# Patient Record
Sex: Male | Born: 1960
Health system: Southern US, Community
[De-identification: ages and names within clinical notes are randomized; demographics above are authoritative.]

## PROBLEM LIST (undated history)

## (undated) ENCOUNTER — Ambulatory Visit

## (undated) DIAGNOSIS — R634 Abnormal weight loss: Secondary | ICD-10-CM

## (undated) DIAGNOSIS — F419 Anxiety disorder, unspecified: Secondary | ICD-10-CM

## (undated) DIAGNOSIS — D509 Iron deficiency anemia, unspecified: Secondary | ICD-10-CM

## (undated) DIAGNOSIS — D649 Anemia, unspecified: Secondary | ICD-10-CM

## (undated) DIAGNOSIS — F1020 Alcohol dependence, uncomplicated: Secondary | ICD-10-CM

## (undated) DIAGNOSIS — E785 Hyperlipidemia, unspecified: Secondary | ICD-10-CM

## (undated) DIAGNOSIS — R011 Cardiac murmur, unspecified: Secondary | ICD-10-CM

## (undated) DIAGNOSIS — K449 Diaphragmatic hernia without obstruction or gangrene: Secondary | ICD-10-CM

## (undated) DIAGNOSIS — K219 Gastro-esophageal reflux disease without esophagitis: Secondary | ICD-10-CM

## (undated) DIAGNOSIS — K760 Fatty (change of) liver, not elsewhere classified: Secondary | ICD-10-CM

## (undated) DIAGNOSIS — F32A Depression, unspecified: Secondary | ICD-10-CM

## (undated) DIAGNOSIS — R1310 Dysphagia, unspecified: Secondary | ICD-10-CM

## (undated) DIAGNOSIS — I10 Essential (primary) hypertension: Secondary | ICD-10-CM

## (undated) DIAGNOSIS — K222 Esophageal obstruction: Secondary | ICD-10-CM

## (undated) DIAGNOSIS — K900 Celiac disease: Secondary | ICD-10-CM

## (undated) HISTORY — DX: Hyperlipidemia, unspecified: E78.5

## (undated) HISTORY — PX: HEMORRHOID SURGERY: SHX153

## (undated) HISTORY — DX: Anxiety disorder, unspecified: F41.9

## (undated) HISTORY — PX: INNER EAR SURGERY: SHX679

## (undated) HISTORY — DX: Gastro-esophageal reflux disease without esophagitis: K21.9

## (undated) HISTORY — DX: Essential (primary) hypertension: I10

## (undated) HISTORY — DX: Alcohol dependence, uncomplicated: F10.20

## (undated) HISTORY — DX: Depression, unspecified: F32.A

## (undated) HISTORY — DX: Anemia, unspecified: D64.9

---

## 2009-04-14 HISTORY — PX: LUMBAR FUSION: SHX111

## 2012-06-10 DIAGNOSIS — Z8669 Personal history of other diseases of the nervous system and sense organs: Secondary | ICD-10-CM | POA: Insufficient documentation

## 2012-06-10 DIAGNOSIS — H9072 Mixed conductive and sensorineural hearing loss, unilateral, left ear, with unrestricted hearing on the contralateral side: Secondary | ICD-10-CM | POA: Insufficient documentation

## 2013-12-28 ENCOUNTER — Ambulatory Visit: Payer: Self-pay | Admitting: Physician Assistant

## 2018-01-07 DIAGNOSIS — I10 Essential (primary) hypertension: Secondary | ICD-10-CM | POA: Insufficient documentation

## 2019-05-12 DIAGNOSIS — E559 Vitamin D deficiency, unspecified: Secondary | ICD-10-CM | POA: Insufficient documentation

## 2019-05-12 DIAGNOSIS — F419 Anxiety disorder, unspecified: Secondary | ICD-10-CM | POA: Insufficient documentation

## 2019-05-12 DIAGNOSIS — D72819 Decreased white blood cell count, unspecified: Secondary | ICD-10-CM | POA: Insufficient documentation

## 2019-05-12 DIAGNOSIS — E785 Hyperlipidemia, unspecified: Secondary | ICD-10-CM | POA: Insufficient documentation

## 2019-05-12 DIAGNOSIS — F32A Depression, unspecified: Secondary | ICD-10-CM | POA: Insufficient documentation

## 2019-05-12 DIAGNOSIS — Z6830 Body mass index (BMI) 30.0-30.9, adult: Secondary | ICD-10-CM | POA: Insufficient documentation

## 2019-05-12 DIAGNOSIS — E6609 Other obesity due to excess calories: Secondary | ICD-10-CM | POA: Insufficient documentation

## 2019-05-12 DIAGNOSIS — K219 Gastro-esophageal reflux disease without esophagitis: Secondary | ICD-10-CM | POA: Insufficient documentation

## 2020-05-22 ENCOUNTER — Other Ambulatory Visit: Payer: Self-pay

## 2020-05-22 ENCOUNTER — Encounter: Payer: Self-pay | Admitting: Family Medicine

## 2020-05-22 ENCOUNTER — Ambulatory Visit: Payer: Medicare PPO | Admitting: Family Medicine

## 2020-05-22 VITALS — BP 120/80 | HR 88 | Ht 70.0 in | Wt 203.0 lb

## 2020-05-22 DIAGNOSIS — Z7689 Persons encountering health services in other specified circumstances: Secondary | ICD-10-CM | POA: Diagnosis not present

## 2020-05-22 DIAGNOSIS — F320 Major depressive disorder, single episode, mild: Secondary | ICD-10-CM

## 2020-05-22 NOTE — Progress Notes (Addendum)
Date:  05/22/2020   Name:  Chad Hudson   DOB:  06/01/1960   MRN:  [id number]   Chief Complaint: Establish Care  Patient is a 60 year old male who presents for a establishment care exam. The patient reports the following problems: establish care. Health maintenance has been reviewed up to date.  Depression        This is a chronic problem.  The current episode started more than 1 year ago.   The onset quality is gradual.   The problem has been gradually improving since onset.  Associated symptoms include fatigue, insomnia, decreased interest and sad.  Associated symptoms include no decreased concentration, no helplessness, no hopelessness, not irritable, no restlessness, no appetite change, no body aches, no myalgias, no headaches, no indigestion and no suicidal ideas.  Past treatments include SSRIs - Selective serotonin reuptake inhibitors.   No results found for: CREATININE, BUN, NA, K, CL, CO2 No results found for: CHOL, HDL, LDLCALC, LDLDIRECT, TRIG, CHOLHDL No results found for: TSH No results found for: HGBA1C No results found for: WBC, HGB, HCT, MCV, PLT No results found for: ALT, AST, GGT, ALKPHOS, BILITOT   Review of Systems  Constitutional: Positive for fatigue. Negative for appetite change, chills and fever.  HENT: Negative for drooling, ear discharge, ear pain and sore throat.   Respiratory: Negative for cough, chest tightness, shortness of breath and wheezing.   Cardiovascular: Negative for chest pain, palpitations and leg swelling.  Gastrointestinal: Negative for abdominal pain, blood in stool, constipation, diarrhea and nausea.  Endocrine: Negative for polydipsia.  Genitourinary: Negative for dysuria, frequency, hematuria and urgency.  Musculoskeletal: Negative for back pain, myalgias and neck pain.  Skin: Negative for rash.  Allergic/Immunologic: Negative for environmental allergies.  Neurological: Negative for dizziness and headaches.  Hematological: Does  not bruise/bleed easily.  Psychiatric/Behavioral: Positive for depression. Negative for decreased concentration and suicidal ideas. The patient has insomnia. The patient is not nervous/anxious.     Patient Active Problem List   Diagnosis Date Noted  . Class 1 obesity due to excess calories without serious comorbidity with body mass index (BMI) of 30.0 to 30.9 in adult 05/12/2019  . Depression 05/12/2019  . Gastroesophageal reflux disease 05/12/2019  . Hyperlipidemia 05/12/2019  . Leukopenia 05/12/2019  . Essential hypertension 01/07/2018  . History of cholesteatoma 06/10/2012    No Known Allergies  Past Surgical History:  Procedure Laterality Date  . HEMORRHOID SURGERY    . INNER EAR SURGERY    . LUMBAR FUSION  2011    Social History   Tobacco Use  . Smoking status: Former Smoker    Years: 10.00    Quit date: 04/15/2015    Years since quitting: 5.1  . Smokeless tobacco: Never Used  Substance Use Topics  . Alcohol use: Yes    Comment: liquor/ daily- 5 or 6 daily  . Drug use: Not Currently     Medication list has been reviewed and updated.  Current Meds  Medication Sig  . amLODipine (NORVASC) 10 MG tablet Take 1 tablet by mouth daily.  Marland Kitchen losartan (COZAAR) 100 MG tablet Take 1 tablet by mouth daily.  Marland Kitchen omeprazole (PRILOSEC) 40 MG capsule Take 1 capsule by mouth every morning.  . sertraline (ZOLOFT) 100 MG tablet Take 2 tablets by mouth daily.    PHQ 2/9 Scores 05/22/2020  PHQ - 2 Score 3  PHQ- 9 Score 10    GAD 7 : Generalized Anxiety Score 05/22/2020  Nervous,  Anxious, on Edge 0  Control/stop worrying 3  Worry too much - different things 2  Trouble relaxing 0  Restless 1  Easily annoyed or irritable 0  Afraid - awful might happen 0  Total GAD 7 Score 6  Anxiety Difficulty Not difficult at all    BP Readings from Last 3 Encounters:  05/22/20 120/80    Physical Exam Vitals and nursing note reviewed.  Constitutional:      General: He is not  irritable. HENT:     Head: Normocephalic.     Right Ear: Tympanic membrane, ear canal and external ear normal. There is no impacted cerumen.     Left Ear: Tympanic membrane, ear canal and external ear normal. There is no impacted cerumen.     Nose: Nose normal. No congestion or rhinorrhea.     Mouth/Throat:     Mouth: Oropharynx is clear and moist. Mucous membranes are moist.  Eyes:     General: No scleral icterus.       Right eye: No discharge.        Left eye: No discharge.     Extraocular Movements: EOM normal.     Conjunctiva/sclera: Conjunctivae normal.     Pupils: Pupils are equal, round, and reactive to light.  Neck:     Thyroid: No thyromegaly.     Vascular: No JVD.     Trachea: No tracheal deviation.  Cardiovascular:     Rate and Rhythm: Normal rate and regular rhythm.     Pulses: Intact distal pulses.     Heart sounds: Normal heart sounds. No murmur heard. No friction rub. No gallop.   Pulmonary:     Effort: No respiratory distress.     Breath sounds: Normal breath sounds. No wheezing, rhonchi or rales.  Abdominal:     General: Bowel sounds are normal.     Palpations: Abdomen is soft. There is no hepatosplenomegaly or mass.     Tenderness: There is no abdominal tenderness. There is no CVA tenderness, guarding or rebound.  Musculoskeletal:        General: No tenderness or edema. Normal range of motion.     Cervical back: Normal range of motion and neck supple.  Lymphadenopathy:     Cervical: No cervical adenopathy.  Skin:    General: Skin is warm.     Findings: No rash.  Neurological:     Mental Status: He is alert and oriented to person, place, and time.     Cranial Nerves: No cranial nerve deficit.     Deep Tendon Reflexes: Strength normal and reflexes are normal and symmetric.     Wt Readings from Last 3 Encounters:  05/22/20 203 lb (92.1 kg)    BP 120/80   Pulse 88   Ht 5\' 10"  (1.778 m)   Wt 203 lb (92.1 kg)   BMI 29.13 kg/m   Assessment and  Plan:  1. Establishing care with new doctor, encounter for Establishing care with new physician.  Patient is on multiple medications but has sufficient amounts until we can recheck him in 4 weeks at which time we will currently do fasting labs.  Current diagnoses include depression, anemia, hypertension, hyperlipidemia, and gastroesophageal reflux disease.  2. Current mild episode of major depressive disorder, unspecified whether recurrent (HCC) Chronic.  Seemingly under control with medication/sertraline 200 mg daily.  Patient has a history of alcoholism.  We will refer to psychiatry given the dosing of sertraline that he is on and has encouraged  him to discuss his ongoing situation with alcohol. - Ambulatory referral to Psychiatry  We had a discussion about his history of alcoholism which is currently 5-6 airline bottles of alcohol daily which is an improvement from his past.  Because of this and the medications I told him that I would feel more comfortable with referral to psychiatry for follow-up on his psychiatric medication and perhaps discussion on approach for treatment in the future.

## 2020-06-20 ENCOUNTER — Other Ambulatory Visit: Payer: Self-pay

## 2020-06-20 ENCOUNTER — Encounter: Payer: Self-pay | Admitting: Family Medicine

## 2020-06-20 ENCOUNTER — Ambulatory Visit: Payer: Medicare PPO | Admitting: Family Medicine

## 2020-06-20 VITALS — BP 150/100 | HR 80 | Ht 70.0 in | Wt 203.0 lb

## 2020-06-20 DIAGNOSIS — Z862 Personal history of diseases of the blood and blood-forming organs and certain disorders involving the immune mechanism: Secondary | ICD-10-CM

## 2020-06-20 DIAGNOSIS — K219 Gastro-esophageal reflux disease without esophagitis: Secondary | ICD-10-CM

## 2020-06-20 DIAGNOSIS — I10 Essential (primary) hypertension: Secondary | ICD-10-CM

## 2020-06-20 DIAGNOSIS — E78019 Familial hypercholesterolemia, unspecified: Secondary | ICD-10-CM

## 2020-06-20 DIAGNOSIS — R1314 Dysphagia, pharyngoesophageal phase: Secondary | ICD-10-CM

## 2020-06-20 DIAGNOSIS — E7801 Familial hypercholesterolemia: Secondary | ICD-10-CM

## 2020-06-20 DIAGNOSIS — F32A Depression, unspecified: Secondary | ICD-10-CM

## 2020-06-20 DIAGNOSIS — F419 Anxiety disorder, unspecified: Secondary | ICD-10-CM

## 2020-06-20 MED ORDER — LOSARTAN POTASSIUM 100 MG PO TABS: 100.0000 mg | ORAL_TABLET | Freq: Every day | ORAL | 1 refills | Status: DC

## 2020-06-20 MED ORDER — SERTRALINE HCL 100 MG PO TABS: 200.0000 mg | ORAL_TABLET | Freq: Every day | ORAL | 1 refills | Status: DC

## 2020-06-20 MED ORDER — ATORVASTATIN CALCIUM 20 MG PO TABS
40.0000 mg | ORAL_TABLET | Freq: Every day | ORAL | 1 refills | Status: DC
Start: 2020-06-20 — End: 2020-08-03

## 2020-06-20 MED ORDER — AMLODIPINE BESYLATE 10 MG PO TABS: 10.0000 mg | ORAL_TABLET | Freq: Every day | ORAL | 1 refills | Status: DC

## 2020-06-20 MED ORDER — OMEPRAZOLE 40 MG PO CPDR: 40.0000 mg | DELAYED_RELEASE_CAPSULE | Freq: Every morning | ORAL | 1 refills | Status: DC

## 2020-06-20 NOTE — Progress Notes (Signed)
Date:  06/20/2020   Name:  Chad Hudson [facility]   DOB:  Oct 13, 1960   MRN:  [id number]   Chief Complaint: Hypertension, Hyperlipidemia, Gastroesophageal Reflux, Depression, and Anemia (Use to take RX iron supplement- been off x 2 months)  Hypertension This is a chronic problem. The current episode started more than 1 year ago. The problem has been gradually worsening since onset. The problem is uncontrolled. Pertinent negatives include no anxiety, blurred vision, chest pain, headaches, malaise/fatigue, neck pain, orthopnea, palpitations, peripheral edema, PND, shortness of breath or sweats. There are no associated agents to hypertension. Risk factors for coronary artery disease include diabetes mellitus and dyslipidemia. The current treatment provides moderate improvement. There are no compliance problems.  There is no history of angina, kidney disease, CAD/MI, CVA, heart failure, left ventricular hypertrophy, PVD or retinopathy. There is no history of chronic renal disease, a hypertension causing med or renovascular disease.  Hyperlipidemia This is a chronic problem. The current episode started more than 1 year ago. The problem is controlled. Recent lipid tests were reviewed and are variable. He has no history of chronic renal disease, diabetes, hypothyroidism, liver disease, obesity or nephrotic syndrome. Pertinent negatives include no chest pain, focal sensory loss, focal weakness, leg pain, myalgias or shortness of breath. Current antihyperlipidemic treatment includes diet change.  Gastroesophageal Reflux He complains of dysphagia. He reports no abdominal pain, no belching, no chest pain, no choking, no coughing, no early satiety, no globus sensation, no heartburn, no hoarse voice, no nausea, no sore throat, no stridor, no tooth decay, no water brash or no wheezing. This is a chronic problem. The current episode started more than 1 year ago. The problem has been gradually improving. The symptoms  are aggravated by certain foods (dysphagia). Pertinent negatives include no anemia, fatigue, melena, muscle weakness, orthopnea or weight loss. He has tried a PPI for the symptoms. The treatment provided moderate relief. Past procedures do not include an abdominal ultrasound.  Depression        This is a chronic problem.  The current episode started more than 1 year ago.   The problem occurs intermittently.  The problem has been gradually improving since onset.  Associated symptoms include no fatigue, no myalgias and no headaches.  Past treatments include SSRIs - Selective serotonin reuptake inhibitors.  Compliance with treatment is good.   Pertinent negatives include no hypothyroidism and no anxiety. Anemia Presents for follow-up visit. There has been no abdominal pain, bruising/bleeding easily, confusion, fever, leg swelling, malaise/fatigue, pallor, palpitations, paresthesias or weight loss. Signs of blood loss that are present include hematochezia. Signs of blood loss that are not present include hematemesis and melena. (Hx of hemarrhoids)  There is no history of chronic renal disease, heart failure or hypothyroidism.    No results found for: CREATININE, BUN, NA, K, CL, CO2 No results found for: CHOL, HDL, LDLCALC, LDLDIRECT, TRIG, CHOLHDL No results found for: TSH No results found for: HGBA1C No results found for: WBC, HGB, HCT, MCV, PLT No results found for: ALT, AST, GGT, ALKPHOS, BILITOT   Review of Systems  Constitutional: Negative for fatigue, fever, malaise/fatigue and weight loss.  HENT: Negative for hoarse voice and sore throat.   Eyes: Negative for blurred vision.  Respiratory: Negative for cough, choking, shortness of breath and wheezing.   Cardiovascular: Negative for chest pain, palpitations, orthopnea and PND.  Gastrointestinal: Positive for dysphagia and hematochezia. Negative for abdominal pain, heartburn, hematemesis, melena and nausea.  Musculoskeletal: Negative for  myalgias,  muscle weakness and neck pain.  Skin: Negative for pallor.  Neurological: Negative for focal weakness, headaches and paresthesias.  Hematological: Does not bruise/bleed easily.  Psychiatric/Behavioral: Positive for depression. Negative for confusion.    Patient Active Problem List   Diagnosis Date Noted  . Class 1 obesity due to excess calories without serious comorbidity with body mass index (BMI) of 30.0 to 30.9 in adult 05/12/2019  . Depression 05/12/2019  . Gastroesophageal reflux disease 05/12/2019  . Hyperlipidemia 05/12/2019  . Leukopenia 05/12/2019  . Essential hypertension 01/07/2018  . History of cholesteatoma 06/10/2012    No Known Allergies  Past Surgical History:  Procedure Laterality Date  . HEMORRHOID SURGERY    . INNER EAR SURGERY    . LUMBAR FUSION  2011    Social History   Tobacco Use  . Smoking status: Former Smoker    Years: 10.00    Quit date: 04/15/2015    Years since quitting: 5.1  . Smokeless tobacco: Never Used  Substance Use Topics  . Alcohol use: Yes    Comment: liquor/ daily- 5 or 6 daily  . Drug use: Not Currently     Medication list has been reviewed and updated.  Current Meds  Medication Sig  . amLODipine (NORVASC) 10 MG tablet Take 1 tablet by mouth daily.  Marland Kitchen losartan (COZAAR) 100 MG tablet Take 1 tablet by mouth daily.  Marland Kitchen omeprazole (PRILOSEC) 40 MG capsule Take 1 capsule by mouth every morning.  . sertraline (ZOLOFT) 100 MG tablet Take 2 tablets by mouth daily.    PHQ 2/9 Scores 06/20/2020 05/22/2020  PHQ - 2 Score 1 3  PHQ- 9 Score 1 10    GAD 7 : Generalized Anxiety Score 06/20/2020 05/22/2020  Nervous, Anxious, on Edge 0 0  Control/stop worrying 0 3  Worry too much - different things 0 2  Trouble relaxing 0 0  Restless 0 1  Easily annoyed or irritable 0 0  Afraid - awful might happen 0 0  Total GAD 7 Score 0 6  Anxiety Difficulty - Not difficult at all    BP Readings from Last 3 Encounters:  06/20/20 (!)  150/100  05/22/20 120/80    Physical Exam  Wt Readings from Last 3 Encounters:  06/20/20 203 lb (92.1 kg)  05/22/20 203 lb (92.1 kg)    BP (!) 150/100   Pulse 80   Ht 5\' 10"  (1.778 m)   Wt 203 lb (92.1 kg)   BMI 29.13 kg/m   Assessment and Plan: 1. Essential hypertension Chronic.  Uncontrolled.  (Patient did not take antihypertensive meds today) stable.  I have discussed with the patient importance of taking medication and we need to recheck it in 6 weeks at which time hopefully is going to be within range patient will continue on his amlodipine 10 mg once a day and losartan 100 mg once a day.  We will check renal function panel for electrolytes and GFR. - Renal Function Panel - amLODipine (NORVASC) 10 MG tablet; Take 1 tablet (10 mg total) by mouth daily.  Dispense: 90 tablet; Refill: 1 - losartan (COZAAR) 100 MG tablet; Take 1 tablet (100 mg total) by mouth daily.  Dispense: 90 tablet; Refill: 1  2. Gastroesophageal reflux disease without esophagitis Chronic.  Controlled.  Stable.  Patient will continue on omeprazole 40 mg once a day. - omeprazole (PRILOSEC) 40 MG capsule; Take 1 capsule (40 mg total) by mouth every morning.  Dispense: 90 capsule; Refill: 1  3. Pharyngoesophageal  dysphagia Chronic.  Controlled.  Stable.  Patient has had history of dysphagia and we will send for his last colonoscopy and endoscopies that were done in [place]. - omeprazole (PRILOSEC) 40 MG capsule; Take 1 capsule (40 mg total) by mouth every morning.  Dispense: 90 capsule; Refill: 1  4. History of anemia Chronic.  Controlled.  Stable.  Patient says he had a history of hemorrhoids which has been corrected by surgery.  We will check CBC if this is the case. - CBC w/Diff/Platelet  5. Anxiety and depression Chronic.  Controlled.  Stable.  Continue atorvastatin 20 mg 2 tablets once a day as well as checking a lipid panel today for current status. - sertraline (ZOLOFT) 100 MG tablet; Take 2 tablets  (200 mg total) by mouth daily.  Dispense: 180 tablet; Refill: 1  6. Familial hypercholesterolemia Chronic.  Controlled.  Stable.  Continue atorvastatin 20 mg 2 tablets once a day. - Lipid Panel With LDL/HDL Ratio - atorvastatin (LIPITOR) 20 MG tablet; Take 2 tablets (40 mg total) by mouth daily.  Dispense: 180 tablet; Refill: 1

## 2020-06-21 ENCOUNTER — Other Ambulatory Visit: Payer: Self-pay

## 2020-06-21 LAB — LIPID PANEL WITH LDL/HDL RATIO
Cholesterol, Total: 243 mg/dL — ABNORMAL HIGH (ref 100–199)
HDL: 68 mg/dL (ref >39)
LDL Chol Calc (NIH): 145 mg/dL — ABNORMAL HIGH (ref 0–99)
LDL/HDL Ratio: 2.1 ratio (ref 0.0–3.6)
Triglycerides: 169 mg/dL — ABNORMAL HIGH (ref 0–149)
VLDL Cholesterol Cal: 30 mg/dL (ref 5–40)

## 2020-06-21 LAB — RENAL FUNCTION PANEL
Albumin: 4.9 g/dL (ref 3.8–4.9)
BUN/Creatinine Ratio: 13 (ref 9–20)
BUN: 13 mg/dL (ref 6–24)
CO2: 19 mmol/L — ABNORMAL LOW (ref 20–29)
Calcium: 9.5 mg/dL (ref 8.7–10.2)
Chloride: 100 mmol/L (ref 96–106)
Creatinine, Ser: 0.97 mg/dL (ref 0.76–1.27)
Glucose: 72 mg/dL (ref 65–99)
Phosphorus: 3.4 mg/dL (ref 2.8–4.1)
Potassium: 4.4 mmol/L (ref 3.5–5.2)
Sodium: 142 mmol/L (ref 134–144)
eGFR: 91 mL/min/{1.73_m2} (ref >59)

## 2020-06-21 LAB — CBC WITH DIFFERENTIAL/PLATELET
Basophils Absolute: 0 10*3/uL (ref 0.0–0.2)
Basos: 0 %
EOS (ABSOLUTE): 0.1 10*3/uL (ref 0.0–0.4)
Eos: 1 %
Hematocrit: 37.7 % (ref 37.5–51.0)
Hemoglobin: 10.9 g/dL — ABNORMAL LOW (ref 13.0–17.7)
Immature Grans (Abs): 0 10*3/uL (ref 0.0–0.1)
Immature Granulocytes: 0 %
Lymphocytes Absolute: 1.2 10*3/uL (ref 0.7–3.1)
Lymphs: 13 %
MCH: 21 pg — ABNORMAL LOW (ref 26.6–33.0)
MCHC: 28.9 g/dL — ABNORMAL LOW (ref 31.5–35.7)
MCV: 73 fL — ABNORMAL LOW (ref 79–97)
Monocytes Absolute: 0.4 10*3/uL (ref 0.1–0.9)
Monocytes: 5 %
Neutrophils Absolute: 7.2 10*3/uL — ABNORMAL HIGH (ref 1.4–7.0)
Neutrophils: 81 %
Platelets: 202 10*3/uL (ref 150–450)
RBC: 5.2 x10E6/uL (ref 4.14–5.80)
RDW: 19.7 % — ABNORMAL HIGH (ref 11.6–15.4)
WBC: 9 10*3/uL (ref 3.4–10.8)

## 2020-06-22 NOTE — Progress Notes (Signed)
Tried calling patient again. 2nd attempt. Left VM for him to call the office back.

## 2020-06-25 LAB — FERRITIN: Ferritin: 26 ng/mL — ABNORMAL LOW (ref 30–400)

## 2020-06-25 LAB — SPECIMEN STATUS REPORT

## 2020-07-11 ENCOUNTER — Other Ambulatory Visit (INDEPENDENT_AMBULATORY_CARE_PROVIDER_SITE_OTHER): Payer: Medicare PPO

## 2020-07-11 ENCOUNTER — Other Ambulatory Visit: Payer: Self-pay

## 2020-07-11 DIAGNOSIS — Z862 Personal history of diseases of the blood and blood-forming organs and certain disorders involving the immune mechanism: Secondary | ICD-10-CM | POA: Diagnosis not present

## 2020-07-11 DIAGNOSIS — R195 Other fecal abnormalities: Secondary | ICD-10-CM

## 2020-07-11 LAB — HEMOCCULT GUIAC POC 1CARD (OFFICE)
Card #2 Fecal Occult Blod, POC: POSITIVE
Card #3 Fecal Occult Blood, POC: NEGATIVE
Fecal Occult Blood, POC: POSITIVE — AB

## 2020-07-11 NOTE — Progress Notes (Signed)
Placed referral to GI 

## 2020-07-12 ENCOUNTER — Encounter: Payer: Self-pay | Admitting: *Deleted

## 2020-08-03 ENCOUNTER — Ambulatory Visit: Payer: Medicare PPO | Admitting: Family Medicine

## 2020-08-03 ENCOUNTER — Telehealth: Payer: Self-pay | Admitting: Gastroenterology

## 2020-08-03 ENCOUNTER — Other Ambulatory Visit: Payer: Self-pay

## 2020-08-03 ENCOUNTER — Encounter: Payer: Self-pay | Admitting: Family Medicine

## 2020-08-03 VITALS — BP 138/80 | HR 72 | Ht 70.0 in | Wt 200.0 lb

## 2020-08-03 DIAGNOSIS — R195 Other fecal abnormalities: Secondary | ICD-10-CM

## 2020-08-03 DIAGNOSIS — R079 Chest pain, unspecified: Secondary | ICD-10-CM

## 2020-08-03 DIAGNOSIS — I10 Essential (primary) hypertension: Secondary | ICD-10-CM

## 2020-08-03 DIAGNOSIS — F1010 Alcohol abuse, uncomplicated: Secondary | ICD-10-CM

## 2020-08-03 DIAGNOSIS — R1314 Dysphagia, pharyngoesophageal phase: Secondary | ICD-10-CM

## 2020-08-03 DIAGNOSIS — E7801 Familial hypercholesterolemia: Secondary | ICD-10-CM | POA: Diagnosis not present

## 2020-08-03 MED ORDER — EZETIMIBE 10 MG PO TABS: 10.0000 mg | ORAL_TABLET | Freq: Every day | ORAL | 0 refills | Status: DC

## 2020-08-03 NOTE — Progress Notes (Signed)
Date:  08/03/2020   Name:  Chad Hudson [facility]   DOB:  05/12/1960   MRN:  [id number]   Chief Complaint: Anemia (Did not start the ferrous sulfate), Blood In Stools (Positive hems- did not answer call for appt to be scheduled.), and Hyperlipidemia (States cannot take atorvastatin- makes heartburn worse)  Anemia Presents for follow-up visit. There has been no bruising/bleeding easily, malaise/fatigue, palpitations or paresthesias. Signs of blood loss that are not present include hematemesis, hematochezia, melena, menorrhagia and vaginal bleeding. There is no history of chronic renal disease or hypothyroidism. There are no compliance problems.   Hyperlipidemia This is a chronic problem. The current episode started more than 1 year ago. The problem is controlled. He has no history of chronic renal disease, diabetes, hypothyroidism, liver disease, obesity or nephrotic syndrome. Factors aggravating his hyperlipidemia include thiazides. Associated symptoms include shortness of breath. Pertinent negatives include no chest pain, focal sensory loss, focal weakness, leg pain or myalgias. Current antihyperlipidemic treatment includes statins (unable to tolerate). The current treatment provides no improvement of lipids. Compliance problems include medication side effects.  Risk factors for coronary artery disease include dyslipidemia and hypertension.  Chest Pain  This is a new problem. The current episode started 1 to 4 weeks ago. The problem has been waxing and waning. The pain is present in the substernal region. The pain is moderate. The quality of the pain is described as dull and pressure. The pain does not radiate. Associated symptoms include shortness of breath. Pertinent negatives include no diaphoresis, dizziness, exertional chest pressure, leg pain, malaise/fatigue, nausea or palpitations. He has tried nothing for the symptoms. The treatment provided mild relief.  His past medical history is  significant for hyperlipidemia.  Pertinent negatives for past medical history include no diabetes.    Lab Results  Component Value Date   CREATININE 0.97 06/20/2020   BUN 13 06/20/2020   NA 142 06/20/2020   K 4.4 06/20/2020   CL 100 06/20/2020   CO2 19 (L) 06/20/2020   Lab Results  Component Value Date   CHOL 243 (H) 06/20/2020   HDL 68 06/20/2020   LDLCALC 145 (H) 06/20/2020   TRIG 169 (H) 06/20/2020   No results found for: TSH No results found for: HGBA1C Lab Results  Component Value Date   WBC 9.0 06/20/2020   HGB 10.9 (L) 06/20/2020   HCT 37.7 06/20/2020   MCV 73 (L) 06/20/2020   PLT 202 06/20/2020   No results found for: ALT, AST, GGT, ALKPHOS, BILITOT   Review of Systems  Constitutional: Negative for diaphoresis and malaise/fatigue.  Respiratory: Positive for shortness of breath.   Cardiovascular: Negative for chest pain and palpitations.  Gastrointestinal: Negative for hematemesis, hematochezia, melena and nausea.  Genitourinary: Negative for menorrhagia and vaginal bleeding.  Musculoskeletal: Negative for myalgias.  Neurological: Negative for dizziness, focal weakness and paresthesias.  Hematological: Does not bruise/bleed easily.    Patient Active Problem List   Diagnosis Date Noted  . Class 1 obesity due to excess calories without serious comorbidity with body mass index (BMI) of 30.0 to 30.9 in adult 05/12/2019  . Depression 05/12/2019  . Gastroesophageal reflux disease 05/12/2019  . Hyperlipidemia 05/12/2019  . Leukopenia 05/12/2019  . Essential hypertension 01/07/2018  . History of cholesteatoma 06/10/2012    No Known Allergies  Past Surgical History:  Procedure Laterality Date  . HEMORRHOID SURGERY    . INNER EAR SURGERY    . LUMBAR FUSION  2011    Social  History   Tobacco Use  . Smoking status: Former Smoker    Years: 10.00    Quit date: 04/15/2015    Years since quitting: 5.3  . Smokeless tobacco: Never Used  Substance Use Topics   . Alcohol use: Yes    Comment: liquor/ daily- 5 or 6 daily  . Drug use: Not Currently     Medication list has been reviewed and updated.  Current Meds  Medication Sig  . amLODipine (NORVASC) 10 MG tablet Take 1 tablet (10 mg total) by mouth daily.  Marland Kitchen losartan (COZAAR) 100 MG tablet Take 1 tablet (100 mg total) by mouth daily.  Marland Kitchen omeprazole (PRILOSEC) 40 MG capsule Take 1 capsule (40 mg total) by mouth every morning.  . sertraline (ZOLOFT) 100 MG tablet Take 2 tablets (200 mg total) by mouth daily.    PHQ 2/9 Scores 06/20/2020 05/22/2020  PHQ - 2 Score 1 3  PHQ- 9 Score 1 10    GAD 7 : Generalized Anxiety Score 06/20/2020 05/22/2020  Nervous, Anxious, on Edge 0 0  Control/stop worrying 0 3  Worry too much - different things 0 2  Trouble relaxing 0 0  Restless 0 1  Easily annoyed or irritable 0 0  Afraid - awful might happen 0 0  Total GAD 7 Score 0 6  Anxiety Difficulty - Not difficult at all    BP Readings from Last 3 Encounters:  08/03/20 138/80  06/20/20 (!) 150/100  05/22/20 120/80    Physical Exam Vitals and nursing note reviewed.  HENT:     Head: Normocephalic.     Right Ear: Tympanic membrane, ear canal and external ear normal. There is no impacted cerumen.     Left Ear: Tympanic membrane, ear canal and external ear normal. There is no impacted cerumen.     Nose: Nose normal. No congestion or rhinorrhea.     Mouth/Throat:     Mouth: Mucous membranes are moist.  Eyes:     General: No scleral icterus.       Right eye: No discharge.        Left eye: No discharge.     Conjunctiva/sclera: Conjunctivae normal.     Pupils: Pupils are equal, round, and reactive to light.  Neck:     Thyroid: No thyromegaly.     Vascular: No JVD.     Trachea: No tracheal deviation.  Cardiovascular:     Rate and Rhythm: Normal rate and regular rhythm.     Heart sounds: Normal heart sounds. No murmur heard. No friction rub. No gallop.   Pulmonary:     Effort: No respiratory  distress.     Breath sounds: Normal breath sounds. No wheezing or rales.  Abdominal:     General: Bowel sounds are normal.     Palpations: Abdomen is soft. There is no mass.     Tenderness: There is no abdominal tenderness. There is no guarding or rebound.  Musculoskeletal:        General: No tenderness. Normal range of motion.     Cervical back: Normal range of motion and neck supple.  Lymphadenopathy:     Cervical: No cervical adenopathy.  Skin:    General: Skin is warm.     Findings: No rash.  Neurological:     Mental Status: He is alert and oriented to person, place, and time.     Cranial Nerves: No cranial nerve deficit.     Deep Tendon Reflexes: Reflexes are normal and symmetric.  Wt Readings from Last 3 Encounters:  08/03/20 200 lb (90.7 kg)  06/20/20 203 lb (92.1 kg)  05/22/20 203 lb (92.1 kg)    BP 138/80   Pulse 72   Ht 5' 10" (1.778 m)   Wt 200 lb (90.7 kg)   BMI 28.70 kg/m   Assessment and Plan: 1. Chest pain, unspecified type New onset.  Patient has been having "indigestion "presumably he thought from atorvastatin.  Given his multiple risk factors we will obtain an EKG: EKG was noted with sinus rhythm at a rate of 73.  Intervals are normal there is no LVH criteria met for hypertrophy.  There is no evidence of ischemic changes such as Q waves, ST-T wave changes, or delay in R wave progression.  EKG was read as normal - EKG 12-Lead  2. Essential hypertension Chronic.  Controlled.  Stable.  Continue with current regimen.  3. Familial hypercholesterolemia Chronic.  Uncontrolled.  Stable.  Patient has been explained that we will switch him to Zetia and this has no connection whatsoever to statin.  Patient is willing to try the acetamide and understands that there is no connection with statin and therefore any indigestion is likely due to other reasons. - ezetimibe (ZETIA) 10 MG tablet; Take 1 tablet (10 mg total) by mouth daily.  Dispense: 90 tablet; Refill:  0  4. Pharyngoesophageal dysphagia Patient has history of.  She will esophageal dysphagia.  Patient has been referred to GI for further evaluation.  5. Guaiac positive stools Guaiac stools were noted on 2 of 3 specimens being positive.  Referral to GI has been made.  6. ETOH abuse She has been counseled on EtOH use and the necessity for cutting back on volume.

## 2020-08-03 NOTE — Telephone Encounter (Signed)
Patient returned to sender

## 2020-08-06 ENCOUNTER — Other Ambulatory Visit: Payer: Self-pay

## 2020-08-06 ENCOUNTER — Encounter: Payer: Self-pay | Admitting: Gastroenterology

## 2020-08-06 ENCOUNTER — Ambulatory Visit: Payer: Medicare PPO | Admitting: Gastroenterology

## 2020-08-06 VITALS — BP 182/116 | HR 114 | Temp 97.8°F | Ht 70.0 in | Wt 197.4 lb

## 2020-08-06 DIAGNOSIS — D500 Iron deficiency anemia secondary to blood loss (chronic): Secondary | ICD-10-CM

## 2020-08-06 MED ORDER — SUPREP BOWEL PREP KIT 17.5-3.13-1.6 GM/177ML PO SOLN: 1.0000 | ORAL | 0 refills | Status: DC

## 2020-08-06 NOTE — Telephone Encounter (Signed)
error 

## 2020-08-06 NOTE — Progress Notes (Signed)
Gastroenterology Consultation  Referring Provider:     Duanne Limerick, MD Primary Care Physician:  Duanne Limerick, MD Primary Gastroenterologist:  Dr. Servando Snare     Reason for Consultation:     Anemia        HPI:   Chad Hudson is a 60 y.o. y/o male referred for consultation & management of anemia by Dr. Duanne Limerick, MD.  This patient comes in today with a finding of iron deficiency anemia.  The patient had a low ferritin and a low MCV with a hemoglobin of 10.9.  The patient had a colonoscopy at [facility] back in 2017 that was reported to show internal hemorrhoids but nothing else that would require a colonoscopy sooner than 10 years at that time.  That was what was recommended.  The patient was set up for a Hemoccult stool cards which showed 2 out of the 3 to be positive.  The patient had reported to his primary care physician that he did not see any sign of any blood loss such as hematochezia, melena or hematemesis. The patient reports that he drinks approximately 4 airline size bottles of vodka a day. The patient also reports that he had a colonoscopy in the past and reports that his last colonoscopy was 1 year ago.  He also states that he had significant rectal bleeding in the past but reports that he had rectal banding of his hemorrhoids.  Past Medical History:  Diagnosis Date  . Alcoholism (HCC)   . Anemia   . Anxiety   . Depression   . GERD (gastroesophageal reflux disease)   . Hyperlipidemia   . Hypertension     Past Surgical History:  Procedure Laterality Date  . HEMORRHOID SURGERY    . INNER EAR SURGERY    . LUMBAR FUSION  2011    Prior to Admission medications   Medication Sig Start Date End Date Taking? Authorizing Provider  amLODipine (NORVASC) 10 MG tablet Take 1 tablet (10 mg total) by mouth daily. 06/20/20   Duanne Limerick, MD  ezetimibe (ZETIA) 10 MG tablet Take 1 tablet (10 mg total) by mouth daily. 08/03/20   Duanne Limerick, MD  losartan (COZAAR) 100 MG  tablet Take 1 tablet (100 mg total) by mouth daily. 06/20/20   Duanne Limerick, MD  omeprazole (PRILOSEC) 40 MG capsule Take 1 capsule (40 mg total) by mouth every morning. 06/20/20   Duanne Limerick, MD  sertraline (ZOLOFT) 100 MG tablet Take 2 tablets (200 mg total) by mouth daily. 06/20/20   Duanne Limerick, MD    Family History  Problem Relation Age of Onset  . Hypertension Mother   . Cancer Maternal Grandfather      Social History   Tobacco Use  . Smoking status: Former Smoker    Years: 10.00    Quit date: 04/15/2015    Years since quitting: 5.3  . Smokeless tobacco: Never Used  Substance Use Topics  . Alcohol use: Yes    Comment: liquor/ daily- 5 or 6 daily  . Drug use: Not Currently    Allergies as of 08/06/2020  . (No Known Allergies)    Review of Systems:    All systems reviewed and negative except where noted in HPI.   Physical Exam:  There were no vitals taken for this visit. No LMP for male patient. General:   Alert,  Well-developed, well-nourished, pleasant and cooperative in NAD Head:  Normocephalic and atraumatic. Eyes:  Sclera clear, no icterus.   Conjunctiva pink. Ears:  Normal auditory acuity. Neck:  Supple; no masses or thyromegaly. Lungs:  Respirations even and unlabored.  Clear throughout to auscultation.   No wheezes, crackles, or rhonchi. No acute distress. Heart:  Regular rate and rhythm; no murmurs, clicks, rubs, or gallops. Abdomen:  Normal bowel sounds.  No bruits.  Soft, non-tender and non-distended without masses, hepatosplenomegaly or hernias noted.  No guarding or rebound tenderness.  Negative Carnett sign.   Rectal:  Deferred.  Pulses:  Normal pulses noted. Extremities:  No clubbing or edema.  No cyanosis. Neurologic:  Alert and oriented x3;  grossly normal neurologically. Skin:  Intact without significant lesions or rashes.  No jaundice. Lymph Nodes:  No significant cervical adenopathy. Psych:  Alert and cooperative. Normal mood and  affect.  Imaging Studies: No results found.  Assessment and Plan:   Chad Hudson is a 60 y.o. y/o male who comes in with a history of iron deficiency anemia.  The patient denies seeing any overt bleeding although he has had a history of hemorrhoids in the past that were banded at his previous Gastroenterologists office.  The patient had a history of polyps and his last colonoscopy was last year and he believes it was normal.The patient will be set up for an EGD and colonoscopy to look for source of his anemia.  The patient has been told that if this is negative then he may need to have a capsule endoscopy.  He was also told to cut down on his drinking.  The patient has been explained the plan and agrees with it.    Midge Minium, MD. Clementeen Graham    Note: This dictation was prepared with Dragon dictation along with smaller phrase technology. Any transcriptional errors that result from this process are unintentional.

## 2020-08-07 ENCOUNTER — Other Ambulatory Visit: Payer: Self-pay

## 2020-08-07 DIAGNOSIS — D500 Iron deficiency anemia secondary to blood loss (chronic): Secondary | ICD-10-CM

## 2020-08-08 ENCOUNTER — Encounter: Payer: Self-pay | Admitting: Gastroenterology

## 2020-08-08 ENCOUNTER — Other Ambulatory Visit: Payer: Self-pay

## 2020-08-15 NOTE — Discharge Instructions (Signed)

## 2020-08-16 ENCOUNTER — Encounter
Admission: RE | Disposition: A | Discharge status: Home / Self Care | Payer: Self-pay | Source: Home / Self Care | Attending: Gastroenterology

## 2020-08-16 ENCOUNTER — Ambulatory Visit
Admission: RE | Admit: 2020-08-16 | Discharge: 2020-08-16 | Disposition: A | Discharge status: Home / Self Care | Payer: Medicare PPO | Attending: Gastroenterology | Admitting: Gastroenterology

## 2020-08-16 ENCOUNTER — Ambulatory Visit: Payer: Medicare PPO | Admitting: Anesthesiology

## 2020-08-16 ENCOUNTER — Other Ambulatory Visit: Payer: Self-pay

## 2020-08-16 ENCOUNTER — Encounter: Payer: Self-pay | Admitting: Gastroenterology

## 2020-08-16 DIAGNOSIS — K5730 Diverticulosis of large intestine without perforation or abscess without bleeding: Secondary | ICD-10-CM | POA: Insufficient documentation

## 2020-08-16 DIAGNOSIS — Z87891 Personal history of nicotine dependence: Secondary | ICD-10-CM | POA: Insufficient documentation

## 2020-08-16 DIAGNOSIS — K3189 Other diseases of stomach and duodenum: Secondary | ICD-10-CM | POA: Diagnosis not present

## 2020-08-16 DIAGNOSIS — D500 Iron deficiency anemia secondary to blood loss (chronic): Secondary | ICD-10-CM

## 2020-08-16 DIAGNOSIS — K449 Diaphragmatic hernia without obstruction or gangrene: Secondary | ICD-10-CM | POA: Diagnosis not present

## 2020-08-16 DIAGNOSIS — K640 First degree hemorrhoids: Secondary | ICD-10-CM | POA: Diagnosis not present

## 2020-08-16 DIAGNOSIS — D509 Iron deficiency anemia, unspecified: Secondary | ICD-10-CM | POA: Insufficient documentation

## 2020-08-16 DIAGNOSIS — K2980 Duodenitis without bleeding: Secondary | ICD-10-CM | POA: Diagnosis not present

## 2020-08-16 DIAGNOSIS — K222 Esophageal obstruction: Secondary | ICD-10-CM

## 2020-08-16 DIAGNOSIS — Z79899 Other long term (current) drug therapy: Secondary | ICD-10-CM | POA: Diagnosis not present

## 2020-08-16 HISTORY — PX: ESOPHAGOGASTRODUODENOSCOPY (EGD) WITH PROPOFOL: SHX5813

## 2020-08-16 HISTORY — PX: COLONOSCOPY WITH PROPOFOL: SHX5780

## 2020-08-16 HISTORY — PX: ESOPHAGEAL DILATION: SHX303

## 2020-08-16 SURGERY — COLONOSCOPY WITH PROPOFOL
Anesthesia: General | Site: Throat

## 2020-08-16 MED ORDER — LIDOCAINE HCL (CARDIAC) PF 100 MG/5ML IV SOSY
PREFILLED_SYRINGE | INTRAVENOUS | Status: DC | PRN
  Administered 2020-08-16: 50 mg via INTRAVENOUS

## 2020-08-16 MED ORDER — GLYCOPYRROLATE 0.2 MG/ML IJ SOLN
INTRAMUSCULAR | Status: DC | PRN
  Administered 2020-08-16: .1 mg via INTRAVENOUS

## 2020-08-16 MED ORDER — STERILE WATER FOR IRRIGATION IR SOLN
Status: DC | PRN
  Administered 2020-08-16: .05 mL

## 2020-08-16 MED ORDER — PROPOFOL 10 MG/ML IV BOLUS
INTRAVENOUS | Status: DC | PRN
  Administered 2020-08-16: 20 mg via INTRAVENOUS
  Administered 2020-08-16: 50 mg via INTRAVENOUS
  Administered 2020-08-16: 80 mg via INTRAVENOUS
  Administered 2020-08-16 (×2): 30 mg via INTRAVENOUS
  Administered 2020-08-16 (×2): 20 mg via INTRAVENOUS
  Administered 2020-08-16: 40 mg via INTRAVENOUS
  Administered 2020-08-16 (×2): 20 mg via INTRAVENOUS
  Administered 2020-08-16: 40 mg via INTRAVENOUS
  Administered 2020-08-16: 30 mg via INTRAVENOUS
  Administered 2020-08-16: 20 mg via INTRAVENOUS
  Administered 2020-08-16: 40 mg via INTRAVENOUS
  Administered 2020-08-16: 30 mg via INTRAVENOUS

## 2020-08-16 MED ORDER — SODIUM CHLORIDE 0.9 % IV SOLN
INTRAVENOUS | Status: DC
Start: 2020-08-16 — End: 2020-08-16

## 2020-08-16 MED ORDER — LACTATED RINGERS IV SOLN: INTRAVENOUS | Status: DC

## 2020-08-16 SURGICAL SUPPLY — 11 items
BALLN DILATOR 15-18 8 (BALLOONS) ×4
BALLOON DILATOR 15-18 8 (BALLOONS) ×3 IMPLANT
BLOCK BITE 60FR ADLT L/F GRN (MISCELLANEOUS) ×4 IMPLANT
FORCEPS BIOP RAD 4 LRG CAP 4 (CUTTING FORCEPS) ×4 IMPLANT
GOWN CVR UNV OPN BCK APRN NK (MISCELLANEOUS) ×6 IMPLANT
GOWN ISOL THUMB LOOP REG UNIV (MISCELLANEOUS) ×8
KIT PRC NS LF DISP ENDO (KITS) ×3 IMPLANT
KIT PROCEDURE OLYMPUS (KITS) ×4
MANIFOLD NEPTUNE II (INSTRUMENTS) ×4 IMPLANT
SYR INFLATION 60ML (SYRINGE) ×4 IMPLANT
WATER STERILE IRR 250ML POUR (IV SOLUTION) ×4 IMPLANT

## 2020-08-16 NOTE — Anesthesia Preprocedure Evaluation (Addendum)
Anesthesia Evaluation  Patient identified by MRN, date of birth, ID band Patient awake    Reviewed: Allergy & Precautions, NPO status   Airway Mallampati: II  TM Distance: >3 FB     Dental   Pulmonary former smoker (quit 2017),    Pulmonary exam normal        Cardiovascular hypertension,  Rhythm:Regular Rate:Normal  HLD   Neuro/Psych PSYCHIATRIC DISORDERS Anxiety Depression Alcohol dependence - 4 drinks per day   GI/Hepatic GERD  ,  Endo/Other    Renal/GU      Musculoskeletal   Abdominal   Peds  Hematology  (+) anemia ,   Anesthesia Other Findings   Reproductive/Obstetrics                            Anesthesia Physical Anesthesia Plan  ASA: III  Anesthesia Plan: General   Post-op Pain Management:    Induction: Intravenous  PONV Risk Score and Plan: Propofol infusion, TIVA and Treatment may vary due to age or medical condition  Airway Management Planned: Natural Airway and Nasal Cannula  Additional Equipment:   Intra-op Plan:   Post-operative Plan:   Informed Consent: I have reviewed the patients History and Physical, chart, labs and discussed the procedure including the risks, benefits and alternatives for the proposed anesthesia with the patient or authorized representative who has indicated his/her understanding and acceptance.       Plan Discussed with: CRNA  Anesthesia Plan Comments:        Anesthesia Quick Evaluation

## 2020-08-16 NOTE — Anesthesia Procedure Notes (Signed)
Performed by: Walburga Hudman, CRNA Pre-anesthesia Checklist: Patient identified, Emergency Drugs available, Suction available, Timeout performed and Patient being monitored Patient Re-evaluated:Patient Re-evaluated prior to induction Oxygen Delivery Method: Nasal cannula Placement Confirmation: positive ETCO2       

## 2020-08-16 NOTE — Op Note (Addendum)
[facility] Gastroenterology Patient Name: Chad Hudson Procedure Date: 08/16/2020 9:20 AM MRN: [id number] Account #: [id number] Date of Birth: Jan 01, 1961 Admit Type: Outpatient Age: 60 Room: [facility] OR ROOM 01 Gender: Male Note Status: Finalized Procedure:             Colonoscopy Indications:           Iron deficiency anemia Providers:             Midge Minium MD, MD Referring MD:          Duanne Limerick, MD (Referring MD) Medicines:             Propofol per Anesthesia Complications:         No immediate complications. Procedure:             Pre-Anesthesia Assessment:                        - Prior to the procedure, a History and Physical was                         performed, and patient medications and allergies were                         reviewed. The patient's tolerance of previous                         anesthesia was also reviewed. The risks and benefits                         of the procedure and the sedation options and risks                         were discussed with the patient. All questions were                         answered, and informed consent was obtained. Prior                         Anticoagulants: The patient has taken no previous                         anticoagulant or antiplatelet agents. ASA Grade                         Assessment: II - A patient with mild systemic disease.                         After reviewing the risks and benefits, the patient                         was deemed in satisfactory condition to undergo the                         procedure.                        After obtaining informed consent, the colonoscope was  passed under direct vision. Throughout the procedure,                         the patient's blood pressure, pulse, and oxygen                         saturations were monitored continuously. The                         Colonoscope was introduced through the anus and                          advanced to the the cecum, identified by appendiceal                         orifice and ileocecal valve. The colonoscopy was                         performed without difficulty. The patient tolerated                         the procedure well. The quality of the bowel                         preparation was excellent. Findings:      The perianal and digital rectal examinations were normal.      Non-bleeding internal hemorrhoids were found during retroflexion. The       hemorrhoids were Grade I (internal hemorrhoids that do not prolapse).      Many small-mouthed diverticula were found in the sigmoid colon. Impression:            - Non-bleeding internal hemorrhoids.                        - Diverticulosis in the sigmoid colon.                        - No specimens collected. Recommendation:        - Discharge patient to home.                        - Resume previous diet.                        - Continue present medications.                        - To visualize the small bowel, perform video capsule                         endoscopy at appointment to be scheduled. Procedure Code(s):     --- Professional ---                        [phone], Colonoscopy, flexible; diagnostic, including                         collection of specimen(s) by brushing or washing, when  performed (separate procedure) Diagnosis Code(s):     --- Professional ---                        D50.9, Iron deficiency anemia, unspecified CPT copyright 2019 American Medical Association. All rights reserved. The codes documented in this report are preliminary and upon coder review may  be revised to meet current compliance requirements. Midge Minium MD, MD 08/16/2020 9:56:35 AM This report has been signed electronically. Number of Addenda: 0 Note Initiated On: 08/16/2020 9:20 AM Scope Withdrawal Time: 0 hours 8 minutes 39 seconds  Total Procedure Duration: 0 hours 10 minutes 20 seconds   Estimated Blood Loss:  Estimated blood loss: none.      [facility]

## 2020-08-16 NOTE — Transfer of Care (Signed)
Immediate Anesthesia Transfer of Care Note  Patient: Chad Hudson  Procedure(s) Performed: COLONOSCOPY WITH PROPOFOL (N/A Rectum) ESOPHAGOGASTRODUODENOSCOPY (EGD) WITH PROPOFOL (N/A Throat) ESOPHAGEAL DILATION (Throat)  Patient Location: PACU  Anesthesia Type: General  Level of Consciousness: awake, alert  and patient cooperative  Airway and Oxygen Therapy: Patient Spontanous Breathing and Patient connected to supplemental oxygen  Post-op Assessment: Post-op Vital signs reviewed, Patient's Cardiovascular Status Stable, Respiratory Function Stable, Patent Airway and No signs of Nausea or vomiting  Post-op Vital Signs: Reviewed and stable  Complications: No complications documented.

## 2020-08-16 NOTE — H&P (Signed)
 Sheliah Fiorillo, MD FACG [address] [place], [place] [address] Phone:[phone] Fax : [phone]  Primary Care Physician:  Jones, Deanna C, MD Primary Gastroenterologist:  Dr. Coleby Yett  Pre-Procedure History & Physical: HPI:  Chad Hudson is a 59 y.o. male is here for an endoscopy and colonoscopy.   Past Medical History:  Diagnosis Date  . Alcoholism (HCC)   . Anemia   . Anxiety   . Depression   . GERD (gastroesophageal reflux disease)   . Hyperlipidemia   . Hypertension     Past Surgical History:  Procedure Laterality Date  . HEMORRHOID SURGERY    . INNER EAR SURGERY    . LUMBAR FUSION  2011    Prior to Admission medications   Medication Sig Start Date End Date Taking? Authorizing Provider  amLODipine (NORVASC) 10 MG tablet Take 1 tablet (10 mg total) by mouth daily. 06/20/20  Yes Jones, Deanna C, MD  ezetimibe (ZETIA) 10 MG tablet Take 1 tablet (10 mg total) by mouth daily. 08/03/20  Yes Jones, Deanna C, MD  losartan (COZAAR) 100 MG tablet Take 1 tablet (100 mg total) by mouth daily. 06/20/20  Yes Jones, Deanna C, MD  omeprazole (PRILOSEC) 40 MG capsule Take 1 capsule (40 mg total) by mouth every morning. 06/20/20  Yes Jones, Deanna C, MD  sertraline (ZOLOFT) 100 MG tablet Take 2 tablets (200 mg total) by mouth daily. 06/20/20  Yes Jones, Deanna C, MD  Na Sulfate-K Sulfate-Mg Sulf (SUPREP BOWEL PREP KIT) 17.5-3.13-1.6 GM/177ML SOLN Take 1 kit by mouth as directed. 08/06/20   Perseus Westall, MD    Allergies as of 08/07/2020  . (No Known Allergies)    Family History  Problem Relation Age of Onset  . Hypertension Mother   . Cancer Maternal Grandfather     Social History   Socioeconomic History  . Marital status: Married    Spouse name: Not on file  . Number of children: Not on file  . Years of education: Not on file  . Highest education level: Not on file  Occupational History  . Not on file  Tobacco Use  . Smoking status: Former Smoker    Years:  10.00    Quit date: 04/15/2015    Years since quitting: 5.3  . Smokeless tobacco: Never Used  Vaping Use  . Vaping Use: Never used  Substance and Sexual Activity  . Alcohol use: Yes    Comment: liquor/ daily- 5 or 6 daily  . Drug use: Not Currently  . Sexual activity: Not Currently  Other Topics Concern  . Not on file  Social History Narrative  . Not on file   Social Determinants of Health   Financial Resource Strain: Not on file  Food Insecurity: Not on file  Transportation Needs: Not on file  Physical Activity: Not on file  Stress: Not on file  Social Connections: Not on file  Intimate Partner Violence: Not on file    Review of Systems: See HPI, otherwise negative ROS  Physical Exam: BP (!) 156/107   Pulse 81   Temp (!) 97.1 F (36.2 C) (Temporal)   Resp 16   Ht 5' 10" (1.778 m)   Wt 88.5 kg   SpO2 99%   BMI 27.98 kg/m  General:   Alert,  pleasant and cooperative in NAD Head:  Normocephalic and atraumatic. Neck:  Supple; no masses or thyromegaly. Lungs:  Clear throughout to auscultation.    Heart:  Regular rate and rhythm.   Abdomen:  Soft, nontender and nondistended. Normal bowel sounds, without guarding, and without rebound.   Neurologic:  Alert and  oriented x4;  grossly normal neurologically.  Impression/Plan: Chad Hudson is here for an endoscopy and colonoscopy to be performed for low iron   Risks, benefits, limitations, and alternatives regarding  endoscopy and colonoscopy have been reviewed with the patient.  Questions have been answered.  All parties agreeable.   Lucilla Lame, MD  08/16/2020, 9:04 AM

## 2020-08-16 NOTE — Anesthesia Postprocedure Evaluation (Signed)
Anesthesia Post Note  Patient: Chad Hudson  Procedure(s) Performed: COLONOSCOPY WITH PROPOFOL (N/A Rectum) ESOPHAGOGASTRODUODENOSCOPY (EGD) WITH PROPOFOL (N/A Throat) ESOPHAGEAL DILATION (Throat)     Patient location during evaluation: PACU Anesthesia Type: General Level of consciousness: awake Pain management: pain level controlled Vital Signs Assessment: post-procedure vital signs reviewed and stable Respiratory status: respiratory function stable Cardiovascular status: stable Postop Assessment: no signs of nausea or vomiting Anesthetic complications: no   No complications documented.  Jola Babinski

## 2020-08-16 NOTE — Op Note (Signed)
[facility] Gastroenterology Patient Name: Chad Hudson Procedure Date: 08/16/2020 9:26 AM MRN: [id number] Account #: [id number] Date of Birth: 1960-06-08 Admit Type: Outpatient Age: 60 Room: [facility] OR ROOM 01 Gender: Male Note Status: Finalized Procedure:             Upper GI endoscopy Indications:           Iron deficiency anemia Providers:             Midge Minium MD, MD Referring MD:          Duanne Limerick, MD (Referring MD) Medicines:             Propofol per Anesthesia Complications:         No immediate complications. Procedure:             Pre-Anesthesia Assessment:                        - Prior to the procedure, a History and Physical was                         performed, and patient medications and allergies were                         reviewed. The patient's tolerance of previous                         anesthesia was also reviewed. The risks and benefits                         of the procedure and the sedation options and risks                         were discussed with the patient. All questions were                         answered, and informed consent was obtained. Prior                         Anticoagulants: The patient has taken no previous                         anticoagulant or antiplatelet agents. ASA Grade                         Assessment: II - A patient with mild systemic disease.                         After reviewing the risks and benefits, the patient                         was deemed in satisfactory condition to undergo the                         procedure.                        After obtaining informed consent, the endoscope was  passed under direct vision. Throughout the procedure,                         the patient's blood pressure, pulse, and oxygen                         saturations were monitored continuously. The Endoscope                         was introduced through the mouth, and advanced  to the                         second part of duodenum. The upper GI endoscopy was                         accomplished without difficulty. The patient tolerated                         the procedure well. Findings:      A medium-sized hiatal hernia was present.      One benign-appearing, intrinsic mild stenosis was found at the       gastroesophageal junction. The stenosis was traversed. A TTS dilator was       passed through the scope. Dilation with a 15-16.5-18 mm balloon dilator       was performed to 18 mm. The dilation site was examined following       endoscope reinsertion and showed complete resolution of luminal       narrowing.      The stomach was normal.      The examined duodenum was normal. Biopsies were taken with a cold       forceps for histology. Impression:            - Medium-sized hiatal hernia.                        - Benign-appearing esophageal stenosis. Dilated.                        - Normal stomach.                        - Normal examined duodenum. Biopsied. Recommendation:        - Discharge patient to home.                        - Resume previous diet.                        - Continue present medications.                        - Await pathology results.                        - Perform a colonoscopy today. Procedure Code(s):     --- Professional ---                        [phone], Esophagogastroduodenoscopy, flexible,  transoral; with transendoscopic balloon dilation of                         esophagus (less than 30 mm diameter)                        43239, 59, Esophagogastroduodenoscopy, flexible,                         transoral; with biopsy, single or multiple Diagnosis Code(s):     --- Professional ---                        D50.9, Iron deficiency anemia, unspecified                        K22.2, Esophageal obstruction CPT copyright 2019 American Medical Association. All rights reserved. The codes documented in this  report are preliminary and upon coder review may  be revised to meet current compliance requirements. Midge Minium MD, MD 08/16/2020 9:43:17 AM This report has been signed electronically. Number of Addenda: 0 Note Initiated On: 08/16/2020 9:26 AM Total Procedure Duration: 0 hours 5 minutes 26 seconds  Estimated Blood Loss:  Estimated blood loss: none.      [facility]

## 2020-08-17 ENCOUNTER — Encounter: Payer: Self-pay | Admitting: Gastroenterology

## 2020-08-20 LAB — SURGICAL PATHOLOGY

## 2020-08-28 ENCOUNTER — Other Ambulatory Visit: Payer: Self-pay

## 2020-08-28 ENCOUNTER — Telehealth: Payer: Self-pay

## 2020-08-28 DIAGNOSIS — D500 Iron deficiency anemia secondary to blood loss (chronic): Secondary | ICD-10-CM

## 2020-08-28 NOTE — Telephone Encounter (Signed)
Pt returned my call and has been scheduled for a capsule endoscopy for June 7th. Pt will also have labs done tomorrow, 08/29/20.

## 2020-08-28 NOTE — Telephone Encounter (Signed)
-----   Message from Midge Minium, MD sent at 08/27/2020 12:05 PM EDT ----- Let the patient know that his small bowel biopsies showed some chronic inflammation that may be caused by the acid in his stomach or may be caused by a allergy to wheat.  He should have a blood test for celiac sprue.

## 2020-08-31 ENCOUNTER — Other Ambulatory Visit: Payer: Self-pay

## 2020-08-31 ENCOUNTER — Other Ambulatory Visit
Admission: RE | Admit: 2020-08-31 | Discharge: 2020-08-31 | Disposition: A | Discharge status: Home / Self Care | Payer: Medicare PPO | Attending: Gastroenterology | Admitting: Gastroenterology

## 2020-08-31 DIAGNOSIS — D500 Iron deficiency anemia secondary to blood loss (chronic): Secondary | ICD-10-CM | POA: Diagnosis present

## 2020-09-04 LAB — CELIAC DISEASE PANEL
Endomysial Ab, IgA: NEGATIVE
IgA: 220 mg/dL (ref 90–386)
Tissue Transglutaminase Ab, IgA: 6 U/mL — ABNORMAL HIGH (ref 0–3)

## 2020-09-05 ENCOUNTER — Ambulatory Visit: Payer: Self-pay | Admitting: *Deleted

## 2020-09-05 NOTE — Telephone Encounter (Signed)
Pt reports BP this AM 186/110. States went to ED 2 days ago for BP of 207/126 but did not stay due to wait time. States BP has been trending up but could not offer any other values. Pt states "I drink to get it down. I drink every day." States in afternoons BP lower. Pt checked BP during call, 165/95. Denies missing any BP med doses. "In fact I took a tab and 1/2 of the losartan yesterday along with my amlodipine". Pt obviously drinking at time of call. Has difficulty answering some questions. Reports CP, mild at times when BP high. Not presently. Appt made for tomorrow at 820. Advised pt to be certain to keep appt. Verbalizes understanding.Advised ED for severe headache, unilateral weakness, difficulty with speech, visual changes. Verbalizes understanding.  Reason for Disposition . Systolic BP  >= 180 OR Diastolic >= 110  Answer Assessment - Initial Assessment Questions 1. BLOOD PRESSURE: "What is the blood pressure?" "Did you take at least two measurements 5 minutes apart?"    This AM  186/110 2. ONSET: "When did you take your blood pressure?"     This AM 3. HOW: "How did you obtain the blood pressure?" (e.g., visiting nurse, automatic home BP monitor)     Home monitor, arm cuff 4. HISTORY: "Do you have a history of high blood pressure?"     yes 5. MEDICATIONS: "Are you taking any medications for blood pressure?" "Have you missed any doses recently?"     Yes 6. OTHER SYMPTOMS: "Do you have any symptoms?" (e.g., headache, chest pain, blurred vision, difficulty breathing, weakness)     None presently, CP mild at times , not presently.  Protocols used: BLOOD PRESSURE - HIGH-A-AH

## 2020-09-06 ENCOUNTER — Other Ambulatory Visit: Payer: Self-pay

## 2020-09-06 ENCOUNTER — Encounter: Payer: Self-pay | Admitting: Family Medicine

## 2020-09-06 ENCOUNTER — Ambulatory Visit: Payer: Medicare PPO | Admitting: Family Medicine

## 2020-09-06 VITALS — BP 180/100 | HR 80 | Ht 70.0 in | Wt 200.0 lb

## 2020-09-06 DIAGNOSIS — F1010 Alcohol abuse, uncomplicated: Secondary | ICD-10-CM

## 2020-09-06 DIAGNOSIS — D500 Iron deficiency anemia secondary to blood loss (chronic): Secondary | ICD-10-CM

## 2020-09-06 DIAGNOSIS — I10 Essential (primary) hypertension: Secondary | ICD-10-CM | POA: Diagnosis not present

## 2020-09-06 DIAGNOSIS — F5101 Primary insomnia: Secondary | ICD-10-CM | POA: Diagnosis not present

## 2020-09-06 MED ORDER — HYDRALAZINE HCL 10 MG PO TABS
10.0000 mg | ORAL_TABLET | Freq: Three times a day (TID) | ORAL | 1 refills | Status: DC
Start: 2020-09-06 — End: 2020-11-23

## 2020-09-06 NOTE — Patient Instructions (Addendum)
[employer] [facility] [phone] to speak to an addiction specialist for alcohol usage.    Insomnia Insomnia is a sleep disorder that makes it difficult to fall asleep or stay asleep. Insomnia can cause fatigue, low energy, difficulty concentrating, mood swings, and poor performance at work or school. There are three different ways to classify insomnia:  Difficulty falling asleep.  Difficulty staying asleep.  Waking up too early in the morning. Any type of insomnia can be long-term (chronic) or short-term (acute). Both are common. Short-term insomnia usually lasts for three months or less. Chronic insomnia occurs at least three times a week for longer than three months. What are the causes? Insomnia may be caused by another condition, situation, or substance, such as:  Anxiety.  Certain medicines.  Gastroesophageal reflux disease (GERD) or other gastrointestinal conditions.  Asthma or other breathing conditions.  Restless legs syndrome, sleep apnea, or other sleep disorders.  Chronic pain.  Menopause.  Stroke.  Abuse of alcohol, tobacco, or illegal drugs.  Mental health conditions, such as depression.  Caffeine.  Neurological disorders, such as Alzheimer's disease.  An overactive thyroid (hyperthyroidism). Sometimes, the cause of insomnia may not be known. What increases the risk? Risk factors for insomnia include:  Gender. Women are affected more often than men.  Age. Insomnia is more common as you get older.  Stress.  Lack of exercise.  Irregular work schedule or working night shifts.  Traveling between different time zones.  Certain medical and mental health conditions. What are the signs or symptoms? If you have insomnia, the main symptom is having trouble falling asleep or having trouble staying asleep. This may lead to other symptoms, such as:  Feeling fatigued or having low energy.  Feeling nervous about going to  sleep.  Not feeling rested in the morning.  Having trouble concentrating.  Feeling irritable, anxious, or depressed. How is this diagnosed? This condition may be diagnosed based on:  Your symptoms and medical history. Your health care provider may ask about: ? Your sleep habits. ? Any medical conditions you have. ? Your mental health.  A physical exam. How is this treated? Treatment for insomnia depends on the cause. Treatment may focus on treating an underlying condition that is causing insomnia. Treatment may also include:  Medicines to help you sleep.  Counseling or therapy.  Lifestyle adjustments to help you sleep better. Follow these instructions at home: Eating and drinking  Limit or avoid alcohol, caffeinated beverages, and cigarettes, especially close to bedtime. These can disrupt your sleep.  Do not eat a large meal or eat spicy foods right before bedtime. This can lead to digestive discomfort that can make it hard for you to sleep.   Sleep habits  Keep a sleep diary to help you and your health care provider figure out what could be causing your insomnia. Write down: ? When you sleep. ? When you wake up during the night. ? How well you sleep. ? How rested you feel the next day. ? Any side effects of medicines you are taking. ? What you eat and drink.  Make your bedroom a dark, comfortable place where it is easy to fall asleep. ? Put up shades or blackout curtains to block light from outside. ? Use a white noise machine to block noise. ? Keep the temperature cool.  Limit screen use before bedtime. This includes: ? Watching TV. ? Using your smartphone, tablet, or computer.  Stick to a routine that includes going to bed  and waking up at the same times every day and night. This can help you fall asleep faster. Consider making a quiet activity, such as reading, part of your nighttime routine.  Try to avoid taking naps during the day so that you sleep better at  night.  Get out of bed if you are still awake after 15 minutes of trying to sleep. Keep the lights down, but try reading or doing a quiet activity. When you feel sleepy, go back to bed.   General instructions  Take over-the-counter and prescription medicines only as told by your health care provider.  Exercise regularly, as told by your health care provider. Avoid exercise starting several hours before bedtime.  Use relaxation techniques to manage stress. Ask your health care provider to suggest some techniques that may work well for you. These may include: ? Breathing exercises. ? Routines to release muscle tension. ? Visualizing peaceful scenes.  Make sure that you drive carefully. Avoid driving if you feel very sleepy.  Keep all follow-up visits as told by your health care provider. This is important. Contact a health care provider if:  You are tired throughout the day.  You have trouble in your daily routine due to sleepiness.  You continue to have sleep problems, or your sleep problems get worse. Get help right away if:  You have serious thoughts about hurting yourself or someone else. If you ever feel like you may hurt yourself or others, or have thoughts about taking your own life, get help right away. You can go to your nearest emergency department or call:  Your local emergency services (911 in the [place]  A suicide crisis helpline, such as the National Suicide Prevention Lifeline at 786-372-4654. This is open 24 hours a day. Summary  Insomnia is a sleep disorder that makes it difficult to fall asleep or stay asleep.  Insomnia can be long-term (chronic) or short-term (acute).  Treatment for insomnia depends on the cause. Treatment may focus on treating an underlying condition that is causing insomnia.  Keep a sleep diary to help you and your health care provider figure out what could be causing your insomnia. This information is not intended to replace advice given  to you by your health care provider. Make sure you discuss any questions you have with your health care provider. Document Revised: 02/09/2020 Document Reviewed: 02/09/2020 Elsevier Patient Education  2021 [employer].

## 2020-09-06 NOTE — Progress Notes (Signed)
Date:  09/06/2020   Name:  Chad Hudson [facility]   DOB:  01-Aug-1960   MRN:  [id number]   Chief Complaint: Hypertension (Concerned about HTN having to do with his alcohol use. Drinks 6-10 drinks daily. Went to ER but left because wait was too long. ), Insomnia (Patient says he does not sleep well at night. Has trouble falling asleep. Wants to discuss sleep problems.), and Alcohol Problem (Patient says he drinks 6-10 drinks daily.)  Hypertension This is a chronic problem. The current episode started more than 1 year ago. The problem has been gradually worsening since onset. The problem is uncontrolled. Pertinent negatives include no anxiety, blurred vision, chest pain, headaches, malaise/fatigue, neck pain, orthopnea, palpitations, peripheral edema, PND, shortness of breath or sweats. There are no associated agents to hypertension. There are no known risk factors for coronary artery disease. Past treatments include angiotensin blockers and calcium channel blockers. The current treatment provides mild improvement. There is no history of angina, kidney disease, CAD/MI, CVA, heart failure, left ventricular hypertrophy, PVD or retinopathy. There is no history of chronic renal disease, a hypertension causing med or renovascular disease.  Insomnia Primary symptoms: no fragmented sleep, no sleep disturbance, no difficulty falling asleep, no malaise/fatigue.  The current episode started more than one year. The problem has been waxing and waning since onset. The symptoms are aggravated by alcohol, tobacco and SSRI use. How many beverages per day that contain caffeine: 4-5.  Past treatments include alcohol.  Alcohol Problem Pertinent negatives include no fever or nausea.    Lab Results  Component Value Date   CREATININE 0.97 06/20/2020   BUN 13 06/20/2020   NA 142 06/20/2020   K 4.4 06/20/2020   CL 100 06/20/2020   CO2 19 (L) 06/20/2020   Lab Results  Component Value Date   CHOL 243 (H) 06/20/2020    HDL 68 06/20/2020   LDLCALC 145 (H) 06/20/2020   TRIG 169 (H) 06/20/2020   No results found for: TSH No results found for: HGBA1C Lab Results  Component Value Date   WBC 9.0 06/20/2020   HGB 10.9 (L) 06/20/2020   HCT 37.7 06/20/2020   MCV 73 (L) 06/20/2020   PLT 202 06/20/2020   No results found for: ALT, AST, GGT, ALKPHOS, BILITOT   Review of Systems  Constitutional: Negative for chills, fever and malaise/fatigue.  HENT: Negative for drooling, ear discharge, ear pain and sore throat.   Eyes: Negative for blurred vision.  Respiratory: Negative for cough, shortness of breath and wheezing.   Cardiovascular: Negative for chest pain, palpitations, orthopnea, leg swelling and PND.  Gastrointestinal: Negative for abdominal pain, blood in stool, constipation, diarrhea and nausea.  Endocrine: Negative for polydipsia.  Genitourinary: Negative for dysuria, frequency, hematuria and urgency.  Musculoskeletal: Negative for back pain, myalgias and neck pain.  Skin: Negative for rash.  Allergic/Immunologic: Negative for environmental allergies.  Neurological: Negative for dizziness and headaches.  Hematological: Does not bruise/bleed easily.  Psychiatric/Behavioral: Negative for sleep disturbance and suicidal ideas. The patient has insomnia. The patient is not nervous/anxious.     Patient Active Problem List   Diagnosis Date Noted  . Iron deficiency anemia due to chronic blood loss   . Stricture and stenosis of esophagus   . Class 1 obesity due to excess calories without serious comorbidity with body mass index (BMI) of 30.0 to 30.9 in adult 05/12/2019  . Depression 05/12/2019  . Gastroesophageal reflux disease 05/12/2019  . Hyperlipidemia 05/12/2019  . Leukopenia  05/12/2019  . Essential hypertension 01/07/2018  . History of cholesteatoma 06/10/2012    No Known Allergies  Past Surgical History:  Procedure Laterality Date  . COLONOSCOPY WITH PROPOFOL N/A 08/16/2020   Procedure:  COLONOSCOPY WITH PROPOFOL;  Surgeon: Midge Minium, MD;  Location: [facility];  Service: Endoscopy;  Laterality: N/A;  . ESOPHAGEAL DILATION  08/16/2020   Procedure: ESOPHAGEAL DILATION;  Surgeon: Midge Minium, MD;  Location: [facility];  Service: Endoscopy;;  . ESOPHAGOGASTRODUODENOSCOPY (EGD) WITH PROPOFOL N/A 08/16/2020   Procedure: ESOPHAGOGASTRODUODENOSCOPY (EGD) WITH PROPOFOL;  Surgeon: Midge Minium, MD;  Location: [facility];  Service: Endoscopy;  Laterality: N/A;  . HEMORRHOID SURGERY    . INNER EAR SURGERY    . LUMBAR FUSION  2011    Social History   Tobacco Use  . Smoking status: Former Smoker    Years: 10.00    Quit date: 04/15/2015    Years since quitting: 5.4  . Smokeless tobacco: Never Used  Vaping Use  . Vaping Use: Never used  Substance Use Topics  . Alcohol use: Yes    Alcohol/week: 6.0 - 10.0 standard drinks    Types: 6 - 10 Standard drinks or equivalent per week    Comment: 6-10 drinks daily  . Drug use: Not Currently     Medication list has been reviewed and updated.  Current Meds  Medication Sig  . amLODipine (NORVASC) 10 MG tablet Take 1 tablet (10 mg total) by mouth daily.  Marland Kitchen ezetimibe (ZETIA) 10 MG tablet Take 1 tablet (10 mg total) by mouth daily.  Marland Kitchen losartan (COZAAR) 100 MG tablet Take 1 tablet (100 mg total) by mouth daily.  Marland Kitchen omeprazole (PRILOSEC) 40 MG capsule Take 1 capsule (40 mg total) by mouth every morning.  . sertraline (ZOLOFT) 100 MG tablet Take 2 tablets (200 mg total) by mouth daily.    PHQ 2/9 Scores 09/06/2020 06/20/2020 05/22/2020  PHQ - 2 Score 0 1 3  PHQ- 9 Score 8 1 10     GAD 7 : Generalized Anxiety Score 09/06/2020 06/20/2020 05/22/2020  Nervous, Anxious, on Edge 1 0 0  Control/stop worrying 1 0 3  Worry too much - different things 1 0 2  Trouble relaxing 0 0 0  Restless 0 0 1  Easily annoyed or irritable 0 0 0  Afraid - awful might happen 0 0 0  Total GAD 7 Score 3 0 6  Anxiety Difficulty Not difficult at  all - Not difficult at all    BP Readings from Last 3 Encounters:  09/06/20 (!) 180/100  08/16/20 113/88  08/06/20 (!) 182/116    Physical Exam Vitals and nursing note reviewed.  HENT:     Head: Normocephalic.     Right Ear: Tympanic membrane, ear canal and external ear normal.     Left Ear: Tympanic membrane, ear canal and external ear normal.     Nose: Nose normal. No congestion or rhinorrhea.     Mouth/Throat:     Mouth: Mucous membranes are moist.  Eyes:     General: No scleral icterus.       Right eye: No discharge.        Left eye: No discharge.     Conjunctiva/sclera: Conjunctivae normal.     Pupils: Pupils are equal, round, and reactive to light.  Neck:     Thyroid: No thyromegaly.     Vascular: No JVD.     Trachea: No tracheal deviation.  Cardiovascular:  Rate and Rhythm: Normal rate and regular rhythm.     Heart sounds: Normal heart sounds. No murmur heard. No friction rub. No gallop.   Pulmonary:     Effort: No respiratory distress.     Breath sounds: Normal breath sounds. No wheezing, rhonchi or rales.  Abdominal:     General: Bowel sounds are normal.     Palpations: Abdomen is soft. There is no mass.     Tenderness: There is no abdominal tenderness. There is no guarding or rebound.  Musculoskeletal:        General: No tenderness. Normal range of motion.     Cervical back: Normal range of motion and neck supple.  Lymphadenopathy:     Cervical: No cervical adenopathy.  Skin:    General: Skin is warm.     Findings: No rash.  Neurological:     Mental Status: He is alert and oriented to person, place, and time.     Cranial Nerves: No cranial nerve deficit.     Deep Tendon Reflexes: Reflexes are normal and symmetric.     Wt Readings from Last 3 Encounters:  09/06/20 200 lb (90.7 kg)  08/16/20 195 lb (88.5 kg)  08/06/20 197 lb 6.4 oz (89.5 kg)    BP (!) 180/100   Pulse 80   Ht 5\' 10"  (1.778 m)   Wt 200 lb (90.7 kg)   BMI 28.70 kg/m    Assessment and Plan: 1. Essential hypertension Chronic.  Uncontrolled.  Stable.  Blood pressure is elevated 180/100.  We will encourage that he continues his losartan and amlodipine.  Will add hydralazine 10 mg 1 3 times a day and will recheck in 2 to 3 weeks. - hydrALAZINE (APRESOLINE) 10 MG tablet; Take 1 tablet (10 mg total) by mouth 3 (three) times daily.  Dispense: 90 tablet; Refill: 1  2. ETOH abuse Patient has a long history of alcohol abuse and does so on a daily basis.  I have suggested that he look into alcohol detoxification as an inpatient and information has been given for patient to call and to inquire.  3. Iron deficiency anemia due to chronic blood loss Patient has a history of iron deficiency anemia in which he had a positive guaiac and he is currently being followed by GI.  4. Primary insomnia Patient has also a history of primary insomnia which is uncontrolled on over-the-counter meds.  Patient states he has been unable to take trazodone in the past because it gave him bad dreams.  Information has been given to assist in a behavioral modification approach to his insomnia.

## 2020-09-06 NOTE — Telephone Encounter (Signed)
Patient here for appt this morning. Being seen for high BP.

## 2020-09-07 ENCOUNTER — Telehealth: Payer: Self-pay

## 2020-09-07 NOTE — Telephone Encounter (Signed)
-----   Message from Midge Minium, MD sent at 09/05/2020  9:56 AM EDT ----- The patient know that the blood test was not consistent with a wheat allergy.

## 2020-09-07 NOTE — Telephone Encounter (Signed)
Pt's mother notified of lab result.

## 2020-09-14 ENCOUNTER — Ambulatory Visit: Payer: Medicare PPO | Admitting: Family Medicine

## 2020-09-17 ENCOUNTER — Encounter: Payer: Self-pay | Admitting: Gastroenterology

## 2020-09-18 ENCOUNTER — Encounter
Admission: RE | Disposition: A | Discharge status: Home / Self Care | Payer: Self-pay | Source: Home / Self Care | Attending: Gastroenterology

## 2020-09-18 ENCOUNTER — Ambulatory Visit
Admission: RE | Admit: 2020-09-18 | Discharge: 2020-09-18 | Disposition: A | Discharge status: Home / Self Care | Payer: Medicare PPO | Attending: Gastroenterology | Admitting: Gastroenterology

## 2020-09-18 ENCOUNTER — Telehealth: Payer: Self-pay | Admitting: Family Medicine

## 2020-09-18 DIAGNOSIS — D500 Iron deficiency anemia secondary to blood loss (chronic): Secondary | ICD-10-CM | POA: Insufficient documentation

## 2020-09-18 HISTORY — PX: GIVENS CAPSULE STUDY: SHX5432

## 2020-09-18 SURGERY — IMAGING PROCEDURE, GI TRACT, INTRALUMINAL, VIA CAPSULE

## 2020-09-18 NOTE — Progress Notes (Signed)
Patient had [facility] Capsule Study this am, swallowed the pill before 7:30am and asked to return at 4pm to have the capsule study equipment removed.  He called about 1:30pm stating that he had been to the bathroom and had a bowel movement and noted that he had passed the capsule with his stool and that "it is floating and blinking in the toilet".  Patient asked to go ahead and come to hospital and equipment would be removed early.

## 2020-09-18 NOTE — Telephone Encounter (Signed)
Copied from CRM [id number]. Topic: Medicare AWV >> Sep 18, 2020 10:28 AM Claudette Laws R wrote: Reason for CRM:   No answer unable to leave a message for patient to call back and schedule Medicare Annual Wellness Visit (AWV) in office.   If unable to come into the office for AWV,  please offer to do virtually or by telephone.  No hx of AWV eligible for AWVI as of   10/13/2014  Please schedule at anytime with [facility] Health Advisor.      40 Minutes appointment   Any questions, please call me at [phone]

## 2020-09-19 ENCOUNTER — Encounter: Payer: Self-pay | Admitting: Gastroenterology

## 2020-09-27 ENCOUNTER — Ambulatory Visit: Payer: Medicare PPO | Admitting: Family Medicine

## 2020-09-27 ENCOUNTER — Other Ambulatory Visit: Payer: Self-pay

## 2020-09-27 ENCOUNTER — Encounter: Payer: Self-pay | Admitting: Family Medicine

## 2020-09-27 VITALS — BP 170/100 | HR 102 | Ht 70.0 in | Wt 203.0 lb

## 2020-09-27 DIAGNOSIS — Z9119 Patient's noncompliance with other medical treatment and regimen: Secondary | ICD-10-CM | POA: Diagnosis not present

## 2020-09-27 DIAGNOSIS — R0609 Other forms of dyspnea: Secondary | ICD-10-CM

## 2020-09-27 DIAGNOSIS — I10 Essential (primary) hypertension: Secondary | ICD-10-CM | POA: Diagnosis not present

## 2020-09-27 DIAGNOSIS — Z91199 Patient's noncompliance with other medical treatment and regimen due to unspecified reason: Secondary | ICD-10-CM

## 2020-09-27 DIAGNOSIS — R0600 Dyspnea, unspecified: Secondary | ICD-10-CM

## 2020-09-27 NOTE — Progress Notes (Signed)
Date:  09/27/2020   Name:  Chad Hudson [facility]   DOB:  09/28/1960   MRN:  [id number]   Chief Complaint: Hypertension (Added hydralazine tid, but pt only taking qday)  Hypertension This is a chronic problem. The current episode started more than 1 year ago. The problem is unchanged. The problem is uncontrolled. Associated symptoms include shortness of breath. Pertinent negatives include no anxiety, blurred vision, chest pain, headaches, malaise/fatigue, neck pain, orthopnea, palpitations, peripheral edema, PND or sweats. There are no associated agents to hypertension. Past treatments include direct vasodilators, calcium channel blockers and angiotensin blockers. The current treatment provides no improvement. Compliance problems: not taking hydralazine 3 times a day.  There is no history of angina, kidney disease or PVD.  Shortness of Breath This is a chronic problem. The current episode started more than 1 year ago. The problem occurs daily. Pertinent negatives include no abdominal pain, chest pain, claudication, coryza, ear pain, fever, headaches, hemoptysis, leg pain, leg swelling, neck pain, orthopnea, PND, rash, rhinorrhea, sore throat, sputum production, swollen glands, syncope, vomiting or wheezing. Risk factors include smoking. He has tried nothing for the symptoms.   Lab Results  Component Value Date   CREATININE 0.97 06/20/2020   BUN 13 06/20/2020   NA 142 06/20/2020   K 4.4 06/20/2020   CL 100 06/20/2020   CO2 19 (L) 06/20/2020   Lab Results  Component Value Date   CHOL 243 (H) 06/20/2020   HDL 68 06/20/2020   LDLCALC 145 (H) 06/20/2020   TRIG 169 (H) 06/20/2020   No results found for: TSH No results found for: HGBA1C Lab Results  Component Value Date   WBC 9.0 06/20/2020   HGB 10.9 (L) 06/20/2020   HCT 37.7 06/20/2020   MCV 73 (L) 06/20/2020   PLT 202 06/20/2020   No results found for: ALT, AST, GGT, ALKPHOS, BILITOT   Review of Systems  Constitutional:   Negative for chills, fever and malaise/fatigue.  HENT:  Negative for drooling, ear discharge, ear pain, rhinorrhea and sore throat.   Eyes:  Negative for blurred vision.  Respiratory:  Positive for shortness of breath. Negative for cough, hemoptysis, sputum production and wheezing.   Cardiovascular:  Negative for chest pain, palpitations, orthopnea, claudication, leg swelling, syncope and PND.  Gastrointestinal:  Negative for abdominal pain, blood in stool, constipation, diarrhea, nausea and vomiting.  Endocrine: Negative for polydipsia.  Genitourinary:  Negative for dysuria, frequency, hematuria and urgency.  Musculoskeletal:  Negative for back pain, myalgias and neck pain.  Skin:  Negative for rash.  Allergic/Immunologic: Negative for environmental allergies.  Neurological:  Negative for dizziness and headaches.  Hematological:  Does not bruise/bleed easily.  Psychiatric/Behavioral:  Negative for suicidal ideas. The patient is not nervous/anxious.    Patient Active Problem List   Diagnosis Date Noted   Iron deficiency anemia due to chronic blood loss    Stricture and stenosis of esophagus    Class 1 obesity due to excess calories without serious comorbidity with body mass index (BMI) of 30.0 to 30.9 in adult 05/12/2019   Depression 05/12/2019   Gastroesophageal reflux disease 05/12/2019   Hyperlipidemia 05/12/2019   Leukopenia 05/12/2019   Essential hypertension 01/07/2018   History of cholesteatoma 06/10/2012    No Known Allergies  Past Surgical History:  Procedure Laterality Date   COLONOSCOPY WITH PROPOFOL N/A 08/16/2020   Procedure: COLONOSCOPY WITH PROPOFOL;  Surgeon: Midge Minium, MD;  Location: [facility];  Service: Endoscopy;  Laterality: N/A;  ESOPHAGEAL DILATION  08/16/2020   Procedure: ESOPHAGEAL DILATION;  Surgeon: Midge Minium, MD;  Location: [facility];  Service: Endoscopy;;   ESOPHAGOGASTRODUODENOSCOPY (EGD) WITH PROPOFOL N/A 08/16/2020   Procedure:  ESOPHAGOGASTRODUODENOSCOPY (EGD) WITH PROPOFOL;  Surgeon: Midge Minium, MD;  Location: [facility];  Service: Endoscopy;  Laterality: N/A;   GIVENS CAPSULE STUDY N/A 09/18/2020   Procedure: GIVENS CAPSULE STUDY;  Surgeon: Midge Minium, MD;  Location: [facility] END[facility];  Service: Endoscopy;  Laterality: N/A;   HEMORRHOID SURGERY     INNER EAR SURGERY     LUMBAR FUSION  2011    Social History   Tobacco Use   Smoking status: Former    Years: 10.00    Pack years: 0.00    Types: Cigarettes    Quit date: 04/15/2015    Years since quitting: 5.4   Smokeless tobacco: Never  Vaping Use   Vaping Use: Never used  Substance Use Topics   Alcohol use: Yes    Alcohol/week: 6.0 - 10.0 standard drinks    Types: 6 - 10 Standard drinks or equivalent per week    Comment: 6-10 drinks daily   Drug use: Not Currently     Medication list has been reviewed and updated.  Current Meds  Medication Sig   amLODipine (NORVASC) 10 MG tablet Take 1 tablet (10 mg total) by mouth daily.   ezetimibe (ZETIA) 10 MG tablet Take 1 tablet (10 mg total) by mouth daily.   hydrALAZINE (APRESOLINE) 10 MG tablet Take 1 tablet (10 mg total) by mouth 3 (three) times daily.   losartan (COZAAR) 100 MG tablet Take 1 tablet (100 mg total) by mouth daily.   omeprazole (PRILOSEC) 40 MG capsule Take 1 capsule (40 mg total) by mouth every morning.   sertraline (ZOLOFT) 100 MG tablet Take 2 tablets (200 mg total) by mouth daily.    PHQ 2/9 Scores 09/06/2020 06/20/2020 05/22/2020  PHQ - 2 Score 0 1 3  PHQ- 9 Score 8 1 10     GAD 7 : Generalized Anxiety Score 09/06/2020 06/20/2020 05/22/2020  Nervous, Anxious, on Edge 1 0 0  Control/stop worrying 1 0 3  Worry too much - different things 1 0 2  Trouble relaxing 0 0 0  Restless 0 0 1  Easily annoyed or irritable 0 0 0  Afraid - awful might happen 0 0 0  Total GAD 7 Score 3 0 6  Anxiety Difficulty Not difficult at all - Not difficult at all    BP Readings from Last 3 Encounters:   09/27/20 (!) 170/100  09/06/20 (!) 180/100  08/16/20 113/88    Physical Exam Vitals and nursing note reviewed.  HENT:     Head: Normocephalic.     Right Ear: External ear normal.     Left Ear: External ear normal.     Nose: Nose normal.  Eyes:     General: No scleral icterus.       Right eye: No discharge.        Left eye: No discharge.     Conjunctiva/sclera: Conjunctivae normal.     Pupils: Pupils are equal, round, and reactive to light.  Neck:     Thyroid: No thyromegaly.     Vascular: No JVD.     Trachea: No tracheal deviation.  Cardiovascular:     Rate and Rhythm: Normal rate and regular rhythm.     Chest Wall: PMI is not displaced.     Pulses: Normal pulses.  Heart sounds: Normal heart sounds and S1 normal. No murmur heard. No systolic murmur is present.  No diastolic murmur is present.    No friction rub. No gallop. No S3 or S4 sounds.  Pulmonary:     Effort: No respiratory distress.     Breath sounds: Normal breath sounds. No wheezing or rales.  Abdominal:     General: Bowel sounds are normal.     Palpations: Abdomen is soft. There is no mass.     Tenderness: There is no abdominal tenderness. There is no guarding or rebound.  Musculoskeletal:        General: No tenderness. Normal range of motion.     Cervical back: Normal range of motion and neck supple.     Right lower leg: No edema.     Left lower leg: No edema.  Lymphadenopathy:     Cervical: No cervical adenopathy.  Skin:    General: Skin is warm.     Findings: No rash.  Neurological:     Mental Status: He is alert and oriented to person, place, and time.     Cranial Nerves: No cranial nerve deficit.     Deep Tendon Reflexes: Reflexes are normal and symmetric.    Wt Readings from Last 3 Encounters:  09/27/20 203 lb (92.1 kg)  09/06/20 200 lb (90.7 kg)  08/16/20 195 lb (88.5 kg)    BP (!) 170/100   Pulse (!) 102   Ht 5\' 10"  (1.778 m)   Wt 203 lb (92.1 kg)   BMI 29.13 kg/m   Assessment  and Plan:  1. Essential hypertension Chronic.  Uncontrolled.  Stable.  Blood pressure 170/100.  Despite initiating hydralazine 10 mg 3 times a day patient continues to have elevated blood pressure but he has been unable to remember to take the second or third dose during the day.  I discussed on many occasions the importance of control of blood pressure and at this time I would like to approach cardiology for some help and achieving blood pressure control.  Patient is also on a do better with taking his hydralazine I placed in all the dosings on his nightstand so that he can see this.  We will refer to Dr. cardiology for evaluation and treatment thereof. - Ambulatory referral to Cardiology  2. DOE (dyspnea on exertion) Chronic.  Controlled.  Stable.  Patient does admit that he has dyspnea on exertion but this may be due to deconditioning but given his EtOH intake there is concern for cardiomyopathy we will also ask cardiology for evaluation of this possibility. - Ambulatory referral to Cardiology  3. Noncompliance As noted above per patient's lack of taking medication may be be contributing to the lack of control of his blood pressure.

## 2020-10-01 ENCOUNTER — Telehealth: Payer: Self-pay

## 2020-10-01 NOTE — Telephone Encounter (Unsigned)
Copied from CRM [id number]. Topic: General - Other >> Oct 01, 2020 12:38 PM Tamela Oddi wrote: Reason for CRM: Patient would like Delice Bison to call him regarding a referral to a cardiologist.  He stated he was told to call the office if he hadn't heard anything by Monday.  Please advise and call to discuss

## 2020-10-09 ENCOUNTER — Telehealth: Payer: Self-pay | Admitting: Family Medicine

## 2020-10-09 DIAGNOSIS — R0602 Shortness of breath: Secondary | ICD-10-CM | POA: Insufficient documentation

## 2020-10-09 NOTE — Telephone Encounter (Signed)
Copied from CRM [id number]. Topic: Medicare AWV >> Oct 09, 2020  2:26 PM Claudette Laws R wrote: Reason for CRM:  Left message for patient to call back and schedule Medicare Annual Wellness Visit (AWV) in office.   If unable to come into the office for AWV,  please offer to do virtually or by telephone.  No hx of AWV eligible for AWVI as of 10/13/2014  Please schedule at anytime with [facility] Health Advisor.      40 Minutes appointment   Any questions, please call me at [phone]

## 2020-11-23 ENCOUNTER — Other Ambulatory Visit: Payer: Self-pay | Admitting: Family Medicine

## 2020-11-23 DIAGNOSIS — I10 Essential (primary) hypertension: Secondary | ICD-10-CM

## 2020-11-29 ENCOUNTER — Encounter: Payer: Self-pay | Admitting: Gastroenterology

## 2020-12-22 ENCOUNTER — Other Ambulatory Visit: Payer: Self-pay

## 2020-12-22 ENCOUNTER — Telehealth: Payer: Self-pay | Admitting: *Deleted

## 2020-12-22 ENCOUNTER — Ambulatory Visit: Payer: Medicare PPO

## 2020-12-22 NOTE — Telephone Encounter (Signed)
Called patient to complete his AWV. Staff unable to reach patient. Patient mother Chad Hudson picked up the phone and staff introduced self and patient mother stated pt is not at home right now. Per pt mother, patient informed her last night that he forgot he had to work this morning. Per pt mother Saturdays and Sundays are just not good for patient due to having to work. Staff asked if patient could please call office to resch appt and mother agreed to inform patient with the message.

## 2020-12-24 ENCOUNTER — Telehealth: Payer: Self-pay | Admitting: Family Medicine

## 2020-12-24 NOTE — Telephone Encounter (Signed)
Copied from CRM [id number]. Topic: Medicare AWV >> Dec 24, 2020  5:59 PM Claudette Laws R wrote: Reason for CRM: No answer unable to leave a message for patient to call back and schedule Medicare Annual Wellness Visit (AWV) in office.   If unable to come into the office for AWV,  please offer to do virtually or by telephone.  No hx of AWV eligible for AWVI as of  10/13/2014  Please schedule at anytime with [facility] Health Advisor.      40 Minutes appointment   Any questions, please call me at [phone]

## 2020-12-26 ENCOUNTER — Ambulatory Visit (INDEPENDENT_AMBULATORY_CARE_PROVIDER_SITE_OTHER): Payer: Medicare PPO

## 2020-12-26 DIAGNOSIS — Z0000 Encounter for general adult medical examination without abnormal findings: Secondary | ICD-10-CM

## 2020-12-26 NOTE — Progress Notes (Signed)
Subjective:   Chad Hudson is a 60 y.o. male who presents for an Initial Medicare Annual Wellness Visit.  Virtual Visit via Telephone Note  I connected with  Chad Hudson on 12/26/20 at  1:20 PM EDT by telephone and verified that I am speaking with the correct person using two identifiers.  Location: Patient: home Provider: [facility] Persons participating in the virtual visit: patient/Nurse Health Advisor   I discussed the limitations, risks, security and privacy concerns of performing an evaluation and management service by telephone and the availability of in person appointments. The patient expressed understanding and agreed to proceed.  Interactive audio and video telecommunications were attempted between this nurse and patient, however failed, due to patient having technical difficulties OR patient did not have access to video capability.  We continued and completed visit with audio only.  Some vital signs may be absent or patient reported.   Reather Littler, LPN   Review of Systems     Cardiac Risk Factors include: advanced age (>67men, >1 women);male gender;dyslipidemia;hypertension     Objective:    There were no vitals filed for this visit. There is no height or weight on file to calculate BMI.  Advanced Directives 12/26/2020 08/16/2020  Does Patient Have a Medical Advance Directive? No No  Would patient like information on creating a medical advance directive? Yes (MAU/Ambulatory/Procedural Areas - Information given) No - Patient declined    Current Medications (verified) Outpatient Encounter Medications as of 12/26/2020  Medication Sig   amLODipine (NORVASC) 10 MG tablet Take 1 tablet (10 mg total) by mouth daily.   atorvastatin (LIPITOR) 20 MG tablet Take 1 tablet by mouth daily.   hydrALAZINE (APRESOLINE) 10 MG tablet TAKE 1 TABLET(10 MG) BY MOUTH THREE TIMES DAILY   losartan (COZAAR) 100 MG tablet Take 1 tablet (100 mg total) by mouth daily.    omeprazole (PRILOSEC) 40 MG capsule Take 1 capsule (40 mg total) by mouth every morning.   sertraline (ZOLOFT) 100 MG tablet Take 2 tablets (200 mg total) by mouth daily.   ezetimibe (ZETIA) 10 MG tablet Take 1 tablet (10 mg total) by mouth daily. (Patient not taking: Reported on 12/26/2020)   No facility-administered encounter medications on file as of 12/26/2020.    Allergies (verified) Patient has no known allergies.   History: Past Medical History:  Diagnosis Date   Alcoholism (HCC)    Anemia    Anxiety    Depression    GERD (gastroesophageal reflux disease)    Hyperlipidemia    Hypertension    Past Surgical History:  Procedure Laterality Date   COLONOSCOPY WITH PROPOFOL N/A 08/16/2020   Procedure: COLONOSCOPY WITH PROPOFOL;  Surgeon: Midge Minium, MD;  Location: [facility];  Service: Endoscopy;  Laterality: N/A;   ESOPHAGEAL DILATION  08/16/2020   Procedure: ESOPHAGEAL DILATION;  Surgeon: Midge Minium, MD;  Location: [facility];  Service: Endoscopy;;   ESOPHAGOGASTRODUODENOSCOPY (EGD) WITH PROPOFOL N/A 08/16/2020   Procedure: ESOPHAGOGASTRODUODENOSCOPY (EGD) WITH PROPOFOL;  Surgeon: Midge Minium, MD;  Location: [facility];  Service: Endoscopy;  Laterality: N/A;   GIVENS CAPSULE STUDY N/A 09/18/2020   Procedure: GIVENS CAPSULE STUDY;  Surgeon: Midge Minium, MD;  Location: [facility] END[facility];  Service: Endoscopy;  Laterality: N/A;   HEMORRHOID SURGERY     INNER EAR SURGERY     LUMBAR FUSION  2011   Family History  Problem Relation Age of Onset   Hypertension Mother    Cancer Maternal Grandfather  Social History   Socioeconomic History   Marital status: Divorced    Spouse name: Not on file   Number of children: 2   Years of education: Not on file   Highest education level: Not on file  Occupational History   Not on file  Tobacco Use   Smoking status: Former    Years: 10.00    Types: Cigarettes    Quit date: 04/15/2015    Years since quitting: 5.7    Smokeless tobacco: Never  Vaping Use   Vaping Use: Never used  Substance and Sexual Activity   Alcohol use: Yes    Alcohol/week: 6.0 - 10.0 standard drinks    Types: 6 - 10 Standard drinks or equivalent per week    Comment: 3-7 airplane bottles vodka approx 3 days per week 12/26/20   Drug use: Not Currently   Sexual activity: Not Currently  Other Topics Concern   Not on file  Social History Narrative   Pt lives with his mother and helps take care of her   Social Determinants of Health   Financial Resource Strain: Low Risk    Difficulty of Paying Living Expenses: Not hard at all  Food Insecurity: No Food Insecurity   Worried About Programme researcher, broadcasting/film/video in the Last Year: Never true   Barista in the Last Year: Never true  Transportation Needs: No Transportation Needs   Lack of Transportation (Medical): No   Lack of Transportation (Non-Medical): No  Physical Activity: Sufficiently Active   Days of Exercise per Week: 4 days   Minutes of Exercise per Session: 60 min  Stress: No Stress Concern Present   Feeling of Stress : Not at all  Social Connections: Socially Isolated   Frequency of Communication with Friends and Family: Three times a week   Frequency of Social Gatherings with Friends and Family: Once a week   Attends Religious Services: Never   Database administrator or Organizations: No   Attends Engineer, structural: Never   Marital Status: Divorced    Tobacco Counseling Counseling given: Not Answered   Clinical Intake:  Pre-visit preparation completed: Yes  Pain : No/denies pain     Nutritional Risks: None Diabetes: No  How often do you need to have someone help you when you read instructions, pamphlets, or other written materials from your doctor or pharmacy?: 1 - Never    Interpreter Needed?: No  Information entered by :: Reather Littler LPN   Activities of Daily Living In your present state of health, do you have any difficulty  performing the following activities: 12/26/2020 09/06/2020  Hearing? Y N  Comment declines hearing aids -  Vision? N N  Difficulty concentrating or making decisions? N N  Walking or climbing stairs? N N  Dressing or bathing? N N  Doing errands, shopping? N N  Preparing Food and eating ? N -  Using the Toilet? N -  In the past six months, have you accidently leaked urine? N -  Do you have problems with loss of bowel control? N -  Managing your Medications? N -  Managing your Finances? N -  Housekeeping or managing your Housekeeping? N -  Some recent data might be hidden    Patient Care Team: Duanne Limerick, MD as PCP - General (Family Medicine) Midge Minium, MD as Consulting Physician (Gastroenterology)  Indicate any recent Medical Services you may have received from other than [facility] providers in the past year (  date may be approximate).     Assessment:   This is a routine wellness examination for [employer].  Hearing/Vision screen Hearing Screening - Comments:: Pt c/o moderate hearing difficulty; no longer has hearing aids  Vision Screening - Comments:: Past due for eye exam; not established with provider  Dietary issues and exercise activities discussed: Current Exercise Habits: Home exercise routine, Type of exercise: walking, Time (Minutes): 60, Frequency (Times/Week): 4, Weekly Exercise (Minutes/Week): 240, Intensity: Mild, Exercise limited by: None identified   Goals Addressed   None    Depression Screen PHQ 2/9 Scores 12/26/2020 09/06/2020 06/20/2020 05/22/2020  PHQ - 2 Score 0 0 1 3  PHQ- 9 Score - 8 1 10     Fall Risk Fall Risk  12/26/2020 09/06/2020 06/20/2020 05/22/2020  Falls in the past year? 0 0 0 0  Number falls in past yr: 0 0 - -  Injury with Fall? 0 0 - -  Risk for fall due to : No Fall Risks - - -  Follow up Falls prevention discussed Falls evaluation completed Falls evaluation completed Falls evaluation completed    FALL RISK PREVENTION PERTAINING TO THE  HOME:  Any stairs in or around the home? No  If so, are there any without handrails? No  Home free of loose throw rugs in walkways, pet beds, electrical cords, etc? Yes  Adequate lighting in your home to reduce risk of falls? Yes   ASSISTIVE DEVICES UTILIZED TO PREVENT FALLS:  Life alert? No  Use of a cane, walker or w/c? No  Grab bars in the bathroom? Yes  Shower chair or bench in shower? No  Elevated toilet seat or a handicapped toilet? No   TIMED UP AND GO:  Was the test performed? No . Telephonic visit.   Cognitive Function: Normal cognitive status assessed by direct observation by this Nurse Health Advisor. No abnormalities found.          Immunizations Immunization History  Administered Date(s) Administered   Influenza,inj,Quad PF,6+ Mos 06/10/2018   Influenza-Unspecified 02/17/2019, 03/12/2020   Janssen (J&J) SARS-COV-2 Vaccination 07/17/2019   Moderna Sars-Covid-2 Vaccination 03/12/2020   Tdap 05/12/2019    TDAP status: Up to date  Flu Vaccine status: Due, Education has been provided regarding the importance of this vaccine. Advised may receive this vaccine at local pharmacy or Health Dept. Aware to provide a copy of the vaccination record if obtained from local pharmacy or Health Dept. Verbalized acceptance and understanding.  Pneumococcal vaccine status: Due, Education has been provided regarding the importance of this vaccine. Advised may receive this vaccine at local pharmacy or Health Dept. Aware to provide a copy of the vaccination record if obtained from local pharmacy or Health Dept. Verbalized acceptance and understanding.  Covid-19 vaccine status: Completed vaccines  Qualifies for Shingles Vaccine? Yes   Zostavax completed No   Shingrix Completed?: No.    Education has been provided regarding the importance of this vaccine. Patient has been advised to call insurance company to determine out of pocket expense if they have not yet received this vaccine.  Advised may also receive vaccine at local pharmacy or Health Dept. Verbalized acceptance and understanding.  Screening Tests Health Maintenance  Topic Date Due   Pneumococcal Vaccine 32-40 Years old (1 - PCV) Never done   Zoster Vaccines- Shingrix (1 of 2) Never done   COVID-19 Vaccine (3 - Booster for Janssen series) 05/07/2020   INFLUENZA VACCINE  11/12/2020   Hepatitis C Screening  05/22/2021 (Originally 10/23/1978)  HIV Screening  05/22/2021 (Originally 10/23/1975)   COLONOSCOPY (Pts 45-62yrs Insurance coverage will need to be confirmed)  08/16/2025   TETANUS/TDAP  05/11/2029   HPV VACCINES  Aged Out    Health Maintenance  Health Maintenance Due  Topic Date Due   Pneumococcal Vaccine 42-73 Years old (1 - PCV) Never done   Zoster Vaccines- Shingrix (1 of 2) Never done   COVID-19 Vaccine (3 - Booster for Janssen series) 05/07/2020   INFLUENZA VACCINE  11/12/2020    Colorectal cancer screening: Type of screening: Colonoscopy. Completed 08/16/20. Repeat every 5 years  Lung Cancer Screening: (Low Dose CT Chest recommended if Age 76-80 years, 30 pack-year currently smoking OR have quit w/in 15years.) does not qualify.    Additional Screening:  Hepatitis C Screening: does qualify; postponed  Vision Screening: Recommended annual ophthalmology exams for early detection of glaucoma and other disorders of the eye. Is the patient up to date with their annual eye exam?  No  Who is the provider or what is the name of the office in which the patient attends annual eye exams? Not established If pt is not established with a provider, would they like to be referred to a provider to establish care? No .   Dental Screening: Recommended annual dental exams for proper oral hygiene  Community Resource Referral / Chronic Care Management: CRR required this visit?  No   CCM required this visit?  No      Plan:     I have personally reviewed and noted the following in the patient's chart:    Medical and social history Use of alcohol, tobacco or illicit drugs  Current medications and supplements including opioid prescriptions. Patient is not currently taking opioid prescriptions. Functional ability and status Nutritional status Physical activity Advanced directives List of other physicians Hospitalizations, surgeries, and ER visits in previous 12 months Vitals Screenings to include cognitive, depression, and falls Referrals and appointments  In addition, I have reviewed and discussed with patient certain preventive protocols, quality metrics, and best practice recommendations. A written personalized care plan for preventive services as well as general preventive health recommendations were provided to patient.     Reather Littler, LPN   [id number]   Nurse Notes: none

## 2020-12-26 NOTE — Patient Instructions (Signed)
[name] , Thank you for taking time to come for your Medicare Wellness Visit. I appreciate your ongoing commitment to your health goals. Please review the following plan we discussed and let me know if I can assist you in the future.   Screening recommendations/referrals: Colonoscopy: done 08/16/20. Repeat 08/2025 Recommended yearly ophthalmology/optometry visit for glaucoma screening and checkup Recommended yearly dental visit for hygiene and checkup  Vaccinations: Influenza vaccine: done 03/12/20 Pneumococcal vaccine: due Tdap vaccine: done 05/12/19 Shingles vaccine: Shingrix discussed. Please contact your pharmacy for coverage information.  Covid-19: done 07/17/19 & 03/12/20  Advanced directives: Advance directive discussed with you today. I have provided a copy for you to complete at home and have notarized. Once this is complete please bring a copy in to our office so we can scan it into your chart.   Conditions/risks identified: Recommend decreasing alcohol intake  Next appointment: Follow up in one year for your annual wellness visit   Preventive Care 40-64 Years, Male Preventive care refers to lifestyle choices and visits with your health care provider that can promote health and wellness. What does preventive care include? A yearly physical exam. This is also called an annual well check. Dental exams once or twice a year. Routine eye exams. Ask your health care provider how often you should have your eyes checked. Personal lifestyle choices, including: Daily care of your teeth and gums. Regular physical activity. Eating a healthy diet. Avoiding tobacco and drug use. Limiting alcohol use. Practicing safe sex. Taking low-dose aspirin every day starting at age 42. What happens during an annual well check? The services and screenings done by your health care provider during your annual well check will depend on your age, overall health, lifestyle risk factors, and family  history of disease. Counseling  Your health care provider may ask you questions about your: Alcohol use. Tobacco use. Drug use. Emotional well-being. Home and relationship well-being. Sexual activity. Eating habits. Work and work Astronomer. Screening  You may have the following tests or measurements: Height, weight, and BMI. Blood pressure. Lipid and cholesterol levels. These may be checked every 5 years, or more frequently if you are over 15 years old. Skin check. Lung cancer screening. You may have this screening every year starting at age 55 if you have a 30-pack-year history of smoking and currently smoke or have quit within the past 15 years. Fecal occult blood test (FOBT) of the stool. You may have this test every year starting at age 8. Flexible sigmoidoscopy or colonoscopy. You may have a sigmoidoscopy every 5 years or a colonoscopy every 10 years starting at age 58. Prostate cancer screening. Recommendations will vary depending on your family history and other risks. Hepatitis C blood test. Hepatitis B blood test. Sexually transmitted disease (STD) testing. Diabetes screening. This is done by checking your blood sugar (glucose) after you have not eaten for a while (fasting). You may have this done every 1-3 years. Discuss your test results, treatment options, and if necessary, the need for more tests with your health care provider. Vaccines  Your health care provider may recommend certain vaccines, such as: Influenza vaccine. This is recommended every year. Tetanus, diphtheria, and acellular pertussis (Tdap, Td) vaccine. You may need a Td booster every 10 years. Zoster vaccine. You may need this after age 5. Pneumococcal 13-valent conjugate (PCV13) vaccine. You may need this if you have certain conditions and have not been vaccinated. Pneumococcal polysaccharide (PPSV23) vaccine. You may need one or two doses if  you smoke cigarettes or if you have certain  conditions. Talk to your health care provider about which screenings and vaccines you need and how often you need them. This information is not intended to replace advice given to you by your health care provider. Make sure you discuss any questions you have with your health care provider. Document Released: 04/27/2015 Document Revised: 12/19/2015 Document Reviewed: 01/30/2015 Elsevier Interactive Patient Education  2017 [employer].  Fall Prevention in the Home Falls can cause injuries. They can happen to people of all ages. There are many things you can do to make your home safe and to help prevent falls. What can I do on the outside of my home? Regularly fix the edges of walkways and driveways and fix any cracks. Remove anything that might make you trip as you walk through a door, such as a raised step or threshold. Trim any bushes or trees on the path to your home. Use bright outdoor lighting. Clear any walking paths of anything that might make someone trip, such as rocks or tools. Regularly check to see if handrails are loose or broken. Make sure that both sides of any steps have handrails. Any raised decks and porches should have guardrails on the edges. Have any leaves, snow, or ice cleared regularly. Use sand or salt on walking paths during winter. Clean up any spills in your garage right away. This includes oil or grease spills. What can I do in the bathroom? Use night lights. Install grab bars by the toilet and in the tub and shower. Do not use towel bars as grab bars. Use non-skid mats or decals in the tub or shower. If you need to sit down in the shower, use a plastic, non-slip stool. Keep the floor dry. Clean up any water that spills on the floor as soon as it happens. Remove soap buildup in the tub or shower regularly. Attach bath mats securely with double-sided non-slip rug tape. Do not have throw rugs and other things on the floor that can make you trip. What can I do in  the bedroom? Use night lights. Make sure that you have a light by your bed that is easy to reach. Do not use any sheets or blankets that are too big for your bed. They should not hang down onto the floor. Have a firm chair that has side arms. You can use this for support while you get dressed. Do not have throw rugs and other things on the floor that can make you trip. What can I do in the kitchen? Clean up any spills right away. Avoid walking on wet floors. Keep items that you use a lot in easy-to-reach places. If you need to reach something above you, use a strong step stool that has a grab bar. Keep electrical cords out of the way. Do not use floor polish or wax that makes floors slippery. If you must use wax, use non-skid floor wax. Do not have throw rugs and other things on the floor that can make you trip. What can I do with my stairs? Do not leave any items on the stairs. Make sure that there are handrails on both sides of the stairs and use them. Fix handrails that are broken or loose. Make sure that handrails are as long as the stairways. Check any carpeting to make sure that it is firmly attached to the stairs. Fix any carpet that is loose or worn. Avoid having throw rugs at the top or  bottom of the stairs. If you do have throw rugs, attach them to the floor with carpet tape. Make sure that you have a light switch at the top of the stairs and the bottom of the stairs. If you do not have them, ask someone to add them for you. What else can I do to help prevent falls? Wear shoes that: Do not have high heels. Have rubber bottoms. Are comfortable and fit you well. Are closed at the toe. Do not wear sandals. If you use a stepladder: Make sure that it is fully opened. Do not climb a closed stepladder. Make sure that both sides of the stepladder are locked into place. Ask someone to hold it for you, if possible. Clearly [place] and make sure that you can see: Any grab bars or  handrails. First and last steps. Where the edge of each step is. Use tools that help you move around (mobility aids) if they are needed. These include: Canes. Walkers. Scooters. Crutches. Turn on the lights when you go into a dark area. Replace any light bulbs as soon as they burn out. Set up your furniture so you have a clear path. Avoid moving your furniture around. If any of your floors are uneven, fix them. If there are any pets around you, be aware of where they are. Review your medicines with your doctor. Some medicines can make you feel dizzy. This can increase your chance of falling. Ask your doctor what other things that you can do to help prevent falls. This information is not intended to replace advice given to you by your health care provider. Make sure you discuss any questions you have with your health care provider. Document Released: 01/25/2009 Document Revised: 09/06/2015 Document Reviewed: 05/05/2014 Elsevier Interactive Patient Education  2017 [employer].

## 2021-01-08 ENCOUNTER — Other Ambulatory Visit: Payer: Self-pay | Admitting: Family Medicine

## 2021-01-08 DIAGNOSIS — I10 Essential (primary) hypertension: Secondary | ICD-10-CM

## 2021-01-22 ENCOUNTER — Other Ambulatory Visit: Payer: Self-pay | Admitting: Family Medicine

## 2021-01-22 DIAGNOSIS — K219 Gastro-esophageal reflux disease without esophagitis: Secondary | ICD-10-CM

## 2021-01-22 DIAGNOSIS — I10 Essential (primary) hypertension: Secondary | ICD-10-CM

## 2021-01-22 DIAGNOSIS — R1314 Dysphagia, pharyngoesophageal phase: Secondary | ICD-10-CM

## 2021-01-22 NOTE — Telephone Encounter (Signed)
Requested medications are due for refill today yes  Requested medications are on the active medication list yes  Last refill 10/26/20  Last visit 09/27/20  Future visit scheduled 03/29/21  Notes to clinic failed protocol due to labs are greater than 180 days, please assess.

## 2021-03-29 ENCOUNTER — Other Ambulatory Visit: Payer: Self-pay

## 2021-03-29 ENCOUNTER — Ambulatory Visit: Payer: Medicare PPO | Admitting: Family Medicine

## 2021-03-29 ENCOUNTER — Encounter: Payer: Self-pay | Admitting: Family Medicine

## 2021-03-29 VITALS — BP 144/84 | HR 86 | Ht 70.0 in | Wt 211.0 lb

## 2021-03-29 DIAGNOSIS — F419 Anxiety disorder, unspecified: Secondary | ICD-10-CM

## 2021-03-29 DIAGNOSIS — R1314 Dysphagia, pharyngoesophageal phase: Secondary | ICD-10-CM

## 2021-03-29 DIAGNOSIS — I10 Essential (primary) hypertension: Secondary | ICD-10-CM | POA: Diagnosis not present

## 2021-03-29 DIAGNOSIS — F32A Depression, unspecified: Secondary | ICD-10-CM

## 2021-03-29 DIAGNOSIS — K219 Gastro-esophageal reflux disease without esophagitis: Secondary | ICD-10-CM | POA: Diagnosis not present

## 2021-03-29 DIAGNOSIS — H65491 Other chronic nonsuppurative otitis media, right ear: Secondary | ICD-10-CM

## 2021-03-29 MED ORDER — OMEPRAZOLE 40 MG PO CPDR
DELAYED_RELEASE_CAPSULE | ORAL | 1 refills | Status: DC
Start: 2021-03-29 — End: 2021-05-31

## 2021-03-29 MED ORDER — HYDRALAZINE HCL 10 MG PO TABS: 10.0000 mg | ORAL_TABLET | Freq: Three times a day (TID) | ORAL | 1 refills | Status: DC

## 2021-03-29 MED ORDER — SERTRALINE HCL 100 MG PO TABS: 200.0000 mg | ORAL_TABLET | Freq: Every day | ORAL | 1 refills | Status: DC

## 2021-03-29 MED ORDER — AMLODIPINE BESYLATE 10 MG PO TABS: ORAL_TABLET | ORAL | 1 refills | Status: DC

## 2021-03-29 MED ORDER — AMOXICILLIN-POT CLAVULANATE 875-125 MG PO TABS: 1.0000 | ORAL_TABLET | Freq: Two times a day (BID) | ORAL | 0 refills | Status: DC

## 2021-03-29 MED ORDER — LOSARTAN POTASSIUM 100 MG PO TABS: ORAL_TABLET | ORAL | 1 refills | Status: DC

## 2021-03-29 NOTE — Progress Notes (Signed)
Date:  03/29/2021   Name:  Chad Hudson [facility]   DOB:  1960/10/27   MRN:  [id number]   Chief Complaint: Hypertension, Gastroesophageal Reflux, and Depression  Hypertension This is a chronic problem. The current episode started more than 1 year ago. The problem has been gradually improving since onset. The problem is controlled. Associated symptoms include anxiety. Pertinent negatives include no blurred vision, chest pain, headaches, malaise/fatigue, neck pain, orthopnea, palpitations, peripheral edema, PND, shortness of breath or sweats. There are no associated agents to hypertension. Past treatments include calcium channel blockers, direct vasodilators and angiotensin blockers. The current treatment provides moderate improvement. There are no compliance problems.  There is no history of angina, kidney disease, CAD/MI, CVA, heart failure, left ventricular hypertrophy, PVD or retinopathy. There is no history of chronic renal disease, a hypertension causing med or renovascular disease.  Gastroesophageal Reflux He reports no abdominal pain, no chest pain, no coughing, no dysphagia, no heartburn, no nausea, no sore throat or no wheezing. This is a chronic problem. The problem has been gradually improving. Pertinent negatives include no anemia, fatigue, melena, muscle weakness, orthopnea or weight loss. He has tried a PPI for the symptoms. The treatment provided moderate relief.  Depression      The patient presents with depression.  This is a chronic problem.  The current episode started more than 1 year ago.   The onset quality is gradual.   The problem has been gradually improving since onset.  Associated symptoms include no decreased concentration, no fatigue, no helplessness, no hopelessness, does not have insomnia, not irritable, no restlessness, no decreased interest, no appetite change, no body aches, no myalgias, no headaches, no indigestion, not sad and no suicidal ideas.  Past treatments  include SSRIs - Selective serotonin reuptake inhibitors.  Compliance with treatment is variable.  Previous treatment provided mild relief.  Past medical history includes anxiety and depression.     Pertinent negatives include no hypothyroidism. Hyperlipidemia This is a chronic problem. The current episode started more than 1 year ago. The problem is controlled. Recent lipid tests were reviewed and are normal. He has no history of chronic renal disease, diabetes, hypothyroidism, liver disease, obesity or nephrotic syndrome. Pertinent negatives include no chest pain, myalgias or shortness of breath. Current antihyperlipidemic treatment includes statins. The current treatment provides moderate improvement of lipids. There are no compliance problems.    Lab Results  Component Value Date   NA 142 06/20/2020   K 4.4 06/20/2020   CO2 19 (L) 06/20/2020   GLUCOSE 72 06/20/2020   BUN 13 06/20/2020   CREATININE 0.97 06/20/2020   CALCIUM 9.5 06/20/2020   EGFR 91 06/20/2020   Lab Results  Component Value Date   CHOL 243 (H) 06/20/2020   HDL 68 06/20/2020   LDLCALC 145 (H) 06/20/2020   TRIG 169 (H) 06/20/2020   No results found for: TSH No results found for: HGBA1C Lab Results  Component Value Date   WBC 9.0 06/20/2020   HGB 10.9 (L) 06/20/2020   HCT 37.7 06/20/2020   MCV 73 (L) 06/20/2020   PLT 202 06/20/2020   No results found for: ALT, AST, GGT, ALKPHOS, BILITOT No results found for: 25OHVITD2, 25OHVITD3, VD25OH   Review of Systems  Constitutional:  Negative for appetite change, chills, fatigue, fever, malaise/fatigue and weight loss.  HENT:  Negative for drooling, ear discharge, ear pain and sore throat.   Eyes:  Negative for blurred vision.  Respiratory:  Negative for cough, shortness  of breath and wheezing.   Cardiovascular:  Negative for chest pain, palpitations, orthopnea, leg swelling and PND.  Gastrointestinal:  Negative for abdominal pain, blood in stool, constipation, diarrhea,  dysphagia, heartburn, melena and nausea.  Endocrine: Negative for polydipsia.  Genitourinary:  Negative for dysuria, frequency, hematuria and urgency.  Musculoskeletal:  Negative for back pain, myalgias, muscle weakness and neck pain.  Skin:  Negative for rash.  Allergic/Immunologic: Negative for environmental allergies.  Neurological:  Negative for dizziness and headaches.  Hematological:  Does not bruise/bleed easily.  Psychiatric/Behavioral:  Positive for depression. Negative for decreased concentration and suicidal ideas. The patient is not nervous/anxious and does not have insomnia.    Patient Active Problem List   Diagnosis Date Noted   SOBOE (shortness of breath on exertion) 10/09/2020   Iron deficiency anemia due to chronic blood loss    Stricture and stenosis of esophagus    Class 1 obesity due to excess calories without serious comorbidity with body mass index (BMI) of 30.0 to 30.9 in adult 05/12/2019   Depression 05/12/2019   Gastroesophageal reflux disease 05/12/2019   Hyperlipidemia 05/12/2019   Leukopenia 05/12/2019   Vitamin D deficiency 05/12/2019   Essential hypertension 01/07/2018   History of cholesteatoma 06/10/2012   Mixed conductive and sensorineural hearing loss of left ear 06/10/2012    No Known Allergies  Past Surgical History:  Procedure Laterality Date   COLONOSCOPY WITH PROPOFOL N/A 08/16/2020   Procedure: COLONOSCOPY WITH PROPOFOL;  Surgeon: Lucilla Lame, MD;  Location: [place];  Service: Endoscopy;  Laterality: N/A;   ESOPHAGEAL DILATION  08/16/2020   Procedure: ESOPHAGEAL DILATION;  Surgeon: Lucilla Lame, MD;  Location: [place];  Service: Endoscopy;;   ESOPHAGOGASTRODUODENOSCOPY (EGD) WITH PROPOFOL N/A 08/16/2020   Procedure: ESOPHAGOGASTRODUODENOSCOPY (EGD) WITH PROPOFOL;  Surgeon: Lucilla Lame, MD;  Location: [place];  Service: Endoscopy;  Laterality: N/A;   GIVENS CAPSULE STUDY N/A 09/18/2020   Procedure: GIVENS  CAPSULE STUDY;  Surgeon: Lucilla Lame, MD;  [facility];  Service: Endoscopy;  Laterality: N/A;   HEMORRHOID SURGERY     INNER EAR SURGERY     LUMBAR FUSION  2011    Social History   Tobacco Use   Smoking status: Former    Years: 10.00    Types: Cigarettes    Quit date: 04/15/2015    Years since quitting: 5.9   Smokeless tobacco: Never  Vaping Use   Vaping Use: Never used  Substance Use Topics   Alcohol use: Yes    Alcohol/week: 6.0 - 10.0 standard drinks    Types: 6 - 10 Standard drinks or equivalent per week    Comment: 3-7 airplane bottles vodka approx 3 days per week 12/26/20   Drug use: Not Currently     Medication list has been reviewed and updated.  Current Meds  Medication Sig   amLODipine (NORVASC) 10 MG tablet TAKE 1 TABLET(10 MG) BY MOUTH DAILY   atorvastatin (LIPITOR) 20 MG tablet Take 1 tablet by mouth daily.   hydrALAZINE (APRESOLINE) 10 MG tablet TAKE 1 TABLET BY MOUTH THREE TIMES DAILY   losartan (COZAAR) 100 MG tablet TAKE 1 TABLET(100 MG) BY MOUTH DAILY   omeprazole (PRILOSEC) 40 MG capsule TAKE 1 CAPSULE(40 MG) BY MOUTH EVERY MORNING   sertraline (ZOLOFT) 100 MG tablet Take 2 tablets (200 mg total) by mouth daily.    PHQ 2/9 Scores 03/29/2021 12/26/2020 09/06/2020 06/20/2020  PHQ - 2 Score 0 0 0 1  PHQ- 9 Score  0 - 8 1    GAD 7 : Generalized Anxiety Score 03/29/2021 09/06/2020 06/20/2020 05/22/2020  Nervous, Anxious, on Edge 0 1 0 0  Control/stop worrying 0 1 0 3  Worry too much - different things 0 1 0 2  Trouble relaxing 0 0 0 0  Restless 0 0 0 1  Easily annoyed or irritable 0 0 0 0  Afraid - awful might happen 0 0 0 0  Total GAD 7 Score 0 3 0 6  Anxiety Difficulty - Not difficult at all - Not difficult at all    BP Readings from Last 3 Encounters:  03/29/21 (!) 144/84  09/27/20 (!) 170/100  09/06/20 (!) 180/100    Physical Exam Vitals and nursing note reviewed.  Constitutional:      General: He is not irritable. HENT:     Head:  Normocephalic.     Right Ear: External ear normal.     Left Ear: External ear normal.     Nose: Nose normal.  Eyes:     General: No scleral icterus.       Right eye: No discharge.        Left eye: No discharge.     Conjunctiva/sclera: Conjunctivae normal.     Pupils: Pupils are equal, round, and reactive to light.  Neck:     Thyroid: No thyromegaly.     Vascular: No JVD.     Trachea: No tracheal deviation.  Cardiovascular:     Rate and Rhythm: Normal rate and regular rhythm.     Heart sounds: Normal heart sounds. No murmur heard.   No friction rub. No gallop.  Pulmonary:     Effort: No respiratory distress.     Breath sounds: Normal breath sounds. No wheezing, rhonchi or rales.  Abdominal:     General: Bowel sounds are normal.     Palpations: Abdomen is soft. There is no mass.     Tenderness: There is no abdominal tenderness. There is no guarding or rebound.  Musculoskeletal:        General: No tenderness. Normal range of motion.     Cervical back: Normal range of motion and neck supple.  Lymphadenopathy:     Cervical: No cervical adenopathy.  Skin:    General: Skin is warm.     Findings: No rash.  Neurological:     Mental Status: He is alert and oriented to person, place, and time.     Cranial Nerves: No cranial nerve deficit.     Deep Tendon Reflexes: Reflexes are normal and symmetric.    Wt Readings from Last 3 Encounters:  03/29/21 211 lb (95.7 kg)  09/27/20 203 lb (92.1 kg)  09/06/20 200 lb (90.7 kg)    BP (!) 144/84    Pulse 86    Ht $R'5\' 10"'yR$  (1.778 m)    Wt 211 lb (95.7 kg)    SpO2 98%    BMI 30.28 kg/m   Assessment and Plan:  1. Essential hypertension Chronic.  Controlled.  Stable.  Blood pressure today is 144/84.  Patient has been not taking his amlodipine for several days as he ran out.  Patient will continue losartan 100 mg once a day hydralazine 10 mg 3 times a day and amlodipine 10 mg once a day.  Patient will return in 3 months and we will recheck blood  pressure on all 3 preparations. - amLODipine (NORVASC) 10 MG tablet; TAKE 1 TABLET(10 MG) BY MOUTH DAILY  Dispense: 90 tablet; Refill:  1 - hydrALAZINE (APRESOLINE) 10 MG tablet; Take 1 tablet (10 mg total) by mouth 3 (three) times daily.  Dispense: 270 tablet; Refill: 1 - losartan (COZAAR) 100 MG tablet; TAKE 1 TABLET(100 MG) BY MOUTH DAILY  Dispense: 90 tablet; Refill: 1  2. Gastroesophageal reflux disease without esophagitis Chronic.  Controlled.  Stable.  Continue omeprazole 40 mg once a day. - omeprazole (PRILOSEC) 40 MG capsule; TAKE 1 CAPSULE(40 MG) BY MOUTH EVERY MORNING  Dispense: 90 capsule; Refill: 1  3. Pharyngoesophageal dysphagia Chronic.  Controlled.  Patient not having any dysphagia at this time and we discussed that he has had endoscopy as well as colonoscopy in the past without concerns. - omeprazole (PRILOSEC) 40 MG capsule; TAKE 1 CAPSULE(40 MG) BY MOUTH EVERY MORNING  Dispense: 90 capsule; Refill: 1  4. Anxiety and depression Chronic.  Controlled.  Stable.  PHQ 0 gad score 0 continue sertraline 100 mg 2 tablets once a day. - sertraline (ZOLOFT) 100 MG tablet; Take 2 tablets (200 mg total) by mouth daily.  Dispense: 180 tablet; Refill: 1  5. Other chronic nonsuppurative otitis media of right ear New onset.  On examination of the right ear there is either a lot of epithelial debris or a chronic infection or cholesteatoma.  Patient had some surgery done on the left ear that sounds like of a cholesteatoma nature.  I am going to put on Augmentin 875 mg twice a day and referral to ENT for reevaluation of the right ear to see if this is of a surgical nature. - amoxicillin-clavulanate (AUGMENTIN) 875-125 MG tablet; Take 1 tablet by mouth 2 (two) times daily.  Dispense: 20 tablet; Refill: 0 - Ambulatory referral to ENT

## 2021-05-31 ENCOUNTER — Other Ambulatory Visit: Payer: Self-pay

## 2021-05-31 ENCOUNTER — Encounter: Payer: Self-pay | Admitting: Family Medicine

## 2021-05-31 ENCOUNTER — Ambulatory Visit
Admission: RE | Admit: 2021-05-31 | Discharge: 2021-05-31 | Disposition: A | Discharge status: Home / Self Care | Payer: Medicare PPO | Source: Ambulatory Visit | Attending: Family Medicine | Admitting: Family Medicine

## 2021-05-31 ENCOUNTER — Ambulatory Visit
Admission: RE | Admit: 2021-05-31 | Discharge: 2021-05-31 | Disposition: A | Discharge status: Home / Self Care | Payer: Medicare PPO | Attending: Family Medicine | Admitting: Family Medicine

## 2021-05-31 ENCOUNTER — Ambulatory Visit: Payer: Medicare PPO | Admitting: Family Medicine

## 2021-05-31 VITALS — BP 124/82 | HR 74 | Temp 98.3°F | Ht 70.0 in | Wt 197.0 lb

## 2021-05-31 DIAGNOSIS — R0609 Other forms of dyspnea: Secondary | ICD-10-CM | POA: Diagnosis not present

## 2021-05-31 DIAGNOSIS — J440 Chronic obstructive pulmonary disease with acute lower respiratory infection: Secondary | ICD-10-CM | POA: Diagnosis not present

## 2021-05-31 DIAGNOSIS — J209 Acute bronchitis, unspecified: Secondary | ICD-10-CM | POA: Insufficient documentation

## 2021-05-31 DIAGNOSIS — K219 Gastro-esophageal reflux disease without esophagitis: Secondary | ICD-10-CM | POA: Diagnosis not present

## 2021-05-31 DIAGNOSIS — R0600 Dyspnea, unspecified: Secondary | ICD-10-CM | POA: Diagnosis not present

## 2021-05-31 DIAGNOSIS — K449 Diaphragmatic hernia without obstruction or gangrene: Secondary | ICD-10-CM | POA: Diagnosis not present

## 2021-05-31 DIAGNOSIS — R0789 Other chest pain: Secondary | ICD-10-CM | POA: Diagnosis not present

## 2021-05-31 MED ORDER — AMOXICILLIN-POT CLAVULANATE 875-125 MG PO TABS: 1.0000 | ORAL_TABLET | Freq: Two times a day (BID) | ORAL | 0 refills | Status: DC

## 2021-05-31 MED ORDER — ALBUTEROL SULFATE HFA 108 (90 BASE) MCG/ACT IN AERS: 2.0000 | INHALATION_SPRAY | Freq: Four times a day (QID) | RESPIRATORY_TRACT | 2 refills | Status: DC | PRN

## 2021-05-31 MED ORDER — OMEPRAZOLE 40 MG PO CPDR: DELAYED_RELEASE_CAPSULE | ORAL | 1 refills | Status: DC

## 2021-05-31 NOTE — Patient Instructions (Signed)
How to Use a Metered Dose Inhaler A metered dose inhaler (MDI) is a handheld device filled with medicine that must be breathed into the lungs (inhaled). The medicine is delivered by pushing down on a metal canister. This releases a preset amount of spray and mist through the mouth and into the lungs. Each MDI canister holds a certain number of doses (puffs). Using a spacer with a metered dose inhaler may be recommended to help get more medicine into the lungs. A spacer is a plastic tube that connects to the MDI on one end and has a mouthpiece on the other end. A spacer holds the medicine in the tube for a short time. This allows more medicine to be inhaled. The MDI can be used to deliver many kinds of inhaled medicines, including: Quick relief or rescue medicines, such as bronchodilators. Controller medicines, such as corticosteroids. What are the risks? If you do not use your inhaler correctly, medicine might not reach your lungs to help you breathe. If you do not have enough strength to push down the canister to make it spray, ask your health care provider for ways to help. The medicine in the MDI may cause side effects, such as: Mouth sores (thrush). Cough. Hoarseness. Shakiness. Headache. Supplies needed: A metered dose inhaler. A spacer, if recommended. How to use a metered dose inhaler without a spacer  Remove the cap from the inhaler. If you are using the inhaler for the first time, shake it for 5 seconds, turn it away from your face, then release 4 puffs into the air. This is called priming. Shake the inhaler for 5 seconds. Position the inhaler so the top of the canister faces up. Put your index finger on the top of the medicine canister. Support the bottom of the inhaler with your thumb. Breathe out normally and as completely as possible, away from the inhaler. Either place the inhaler between your teeth and close your lips tightly around the mouthpiece, or hold the inhaler 1-2  inches (2.5-5 cm) away from your open mouth. Keep your tongue down out of the way. If you are unsure which technique to use, ask your health care provider. Press the canister down with your index finger to release the medicine. Inhale deeply and slowly through your mouth until your lungs are completely filled. Do not breathe in through your nose. Inhaling should take 4-6 seconds. Hold the medicine in your lungs for 5-10 seconds (10 seconds is best). This helps the medicine get into the small airways of your lungs. Remove the inhaler from your mouth, turn your head, and breathe out normally. Wait about 1 minute between puffs or as directed. Then repeat steps 3-10 until you have taken the number of puffs that your health care provider directed. Put the cap on the inhaler. If you are using a steroid inhaler, rinse your mouth with water, gargle, and spit out the water. Do not swallow the water. How to use a metered dose inhaler with a spacer  Remove the cap from the inhaler. If you are using the inhaler for the first time, shake it for 5 seconds, turn it away from your face, then release 4 puffs into the air. This is called priming. Shake the inhaler for 5 seconds. Place the open end of the spacer onto the inhaler mouthpiece. Position the inhaler so the top of the canister faces up and the spacer mouthpiece faces you. Put your index finger on the top of the medicine canister. Support the   bottom of the inhaler and the spacer with your thumb. Breathe out normally and as completely as possible, away from the spacer. Place the spacer between your teeth and close your lips tightly around it. Keep your tongue down out of the way. Press the canister down with your index finger to release the medicine, then inhale deeply and slowly through your mouth until your lungs are completely filled. Do not breathe in through your nose. Inhaling should take 4-6 seconds. Hold the medicine in your lungs for 5-10 seconds  (10 seconds is best). This helps the medicine get into the small airways of your lungs. Remove the spacer from your mouth, turn your head, and breathe out normally. Wait about 1 minute between puffs or as directed. Then repeat steps 3-11 until you have taken the number of puffs that your health care provider directed. Remove the spacer from the inhaler and put the cap on the inhaler. If you are using a steroid inhaler, rinse your mouth with water, gargle, and spit out the water. Do not swallow the water. Follow these instructions at home: Caring for your MDI Store your inhaler at or near room temperature. A cold MDI will not work properly. Follow directions on the package insert for care and cleaning of your MDI and spacer. General instructions Take your inhaled medicine only as told by your health care provider. Do not use the inhaler more than directed by your health care provider. Refill your MDI with medicine before all the preset doses have been used. If your inhaler has a counter, check it to determine how full your MDI is. The number you see tells you how many doses are left. If your inhaler does not have a counter, ask your health care provider when you will need to refill it. Then write the refill date on a calendar or on your MDI canister. Keep in mind that you cannot tell when the medicine in an inhaler is empty by shaking it. You may feel or hear something in the canister even when the preset medicine doses have been used up. Keeping track of your dosages is important. Do not use any products that contain nicotine or tobacco, such as cigarettes, e-cigarettes, and chewing tobacco. If you need help quitting, ask your health care provider. Keep all follow-up visits as told by your health care provider. This is important. Where to find more information Centers for Disease Control and Prevention: www.cdc.gov American Lung Association: www.lung.org Contact a health care provider  if: Symptoms are only partially relieved with your inhaler. You are having trouble using your inhaler. You have side effects from the medicine. You have chills or a fever. You have night sweats. There is blood in your thick saliva (phlegm). Get help right away if: You have dizziness. You have a fast heart rate. You have severe shortness of breath. You have difficulty breathing. These symptoms may represent a serious problem that is an emergency. Do not wait to see if the symptoms will go away. Get medical help right away. Call your local emergency services (911 in the [place] Do not drive yourself to the hospital. Summary A metered dose inhaler is a handheld device for taking medicine that must be breathed into the lungs (inhaled). Take your inhaled medicine only as told by your health care provider. Do not use the inhaler more than directed by your health care provider. You cannot tell when the medicine is gone in an inhaler by shaking it. Refill it with medicine before   all the preset doses have been used. Follow directions on the package insert for care and cleaning of your MDI and spacer. This information is not intended to replace advice given to you by your health care provider. Make sure you discuss any questions you have with your health care provider. Document Revised: 05/17/2019 Document Reviewed: 05/17/2019 Elsevier Patient Education  2022 [employer] Inc.  

## 2021-05-31 NOTE — Progress Notes (Signed)
Date:  05/31/2021   Name:  Chad Hudson [facility]   DOB:  September 24, 1960   MRN:  [id number]   Chief Complaint: Cough (X 2 weeks- bloody production mixed with green and yellow, no fever- COVID negative)  Cough This is a new problem. The current episode started 1 to 4 weeks ago (at least 2 weeks). The problem has been waxing and waning. The problem occurs hourly. The cough is Productive of purulent sputum and productive of blood-tinged sputum. Associated symptoms include hemoptysis, shortness of breath, weight loss and wheezing. Pertinent negatives include no chest pain, chills, ear congestion, ear pain, fever, headaches, heartburn, myalgias, nasal congestion, postnasal drip, rash, rhinorrhea, sore throat or sweats. He has tried nothing for the symptoms. The treatment provided moderate relief. There is no history of environmental allergies.  Shortness of Breath This is a chronic problem. The current episode started more than 1 year ago. The problem occurs intermittently. The problem has been waxing and waning. Associated symptoms include hemoptysis, neck pain and wheezing. Pertinent negatives include no abdominal pain, chest pain, ear pain, fever, headaches, leg swelling, orthopnea, PND, rash, rhinorrhea or sore throat. The symptoms are aggravated by smoke. Risk factors include smoking. He has tried nothing for the symptoms.   Lab Results  Component Value Date   NA 142 06/20/2020   K 4.4 06/20/2020   CO2 19 (L) 06/20/2020   GLUCOSE 72 06/20/2020   BUN 13 06/20/2020   CREATININE 0.97 06/20/2020   CALCIUM 9.5 06/20/2020   EGFR 91 06/20/2020   Lab Results  Component Value Date   CHOL 243 (H) 06/20/2020   HDL 68 06/20/2020   LDLCALC 145 (H) 06/20/2020   TRIG 169 (H) 06/20/2020   No results found for: TSH No results found for: HGBA1C Lab Results  Component Value Date   WBC 9.0 06/20/2020   HGB 10.9 (L) 06/20/2020   HCT 37.7 06/20/2020   MCV 73 (L) 06/20/2020   PLT 202 06/20/2020   No  results found for: ALT, AST, GGT, ALKPHOS, BILITOT No results found for: 25OHVITD2, 25OHVITD3, VD25OH   Review of Systems  Constitutional:  Positive for weight loss. Negative for chills and fever.  HENT:  Negative for drooling, ear discharge, ear pain, postnasal drip, rhinorrhea and sore throat.   Respiratory:  Positive for cough, hemoptysis, shortness of breath and wheezing.   Cardiovascular:  Negative for chest pain, palpitations, orthopnea, leg swelling and PND.  Gastrointestinal:  Negative for abdominal pain, blood in stool, constipation, diarrhea, heartburn and nausea.  Endocrine: Negative for polydipsia.  Genitourinary:  Negative for dysuria, frequency, hematuria and urgency.  Musculoskeletal:  Positive for neck pain. Negative for back pain and myalgias.  Skin:  Negative for rash.  Allergic/Immunologic: Negative for environmental allergies.  Neurological:  Negative for dizziness and headaches.  Hematological:  Does not bruise/bleed easily.  Psychiatric/Behavioral:  Negative for suicidal ideas. The patient is not nervous/anxious.    Patient Active Problem List   Diagnosis Date Noted   Pharyngoesophageal dysphagia 03/29/2021   SOBOE (shortness of breath on exertion) 10/09/2020   Iron deficiency anemia due to chronic blood loss    Stricture and stenosis of esophagus    Class 1 obesity due to excess calories without serious comorbidity with body mass index (BMI) of 30.0 to 30.9 in adult 05/12/2019   Anxiety and depression 05/12/2019   Gastroesophageal reflux disease 05/12/2019   Hyperlipidemia 05/12/2019   Leukopenia 05/12/2019   Vitamin D deficiency 05/12/2019   Essential hypertension  01/07/2018   History of cholesteatoma 06/10/2012   Mixed conductive and sensorineural hearing loss of left ear 06/10/2012    No Known Allergies  Past Surgical History:  Procedure Laterality Date   COLONOSCOPY WITH PROPOFOL N/A 08/16/2020   Procedure: COLONOSCOPY WITH PROPOFOL;  Surgeon: Lucilla Lame, MD;  Location: [facility];  Service: Endoscopy;  Laterality: N/A;   ESOPHAGEAL DILATION  08/16/2020   Procedure: ESOPHAGEAL DILATION;  Surgeon: Lucilla Lame, MD;  Location: [facility];  Service: Endoscopy;;   ESOPHAGOGASTRODUODENOSCOPY (EGD) WITH PROPOFOL N/A 08/16/2020   Procedure: ESOPHAGOGASTRODUODENOSCOPY (EGD) WITH PROPOFOL;  Surgeon: Lucilla Lame, MD;  Location: [facility];  Service: Endoscopy;  Laterality: N/A;   GIVENS CAPSULE STUDY N/A 09/18/2020   Procedure: GIVENS CAPSULE STUDY;  Surgeon: Lucilla Lame, MD;  Location: [facility] ENDOSCOPY;  Service: Endoscopy;  Laterality: N/A;   HEMORRHOID SURGERY     INNER EAR SURGERY     LUMBAR FUSION  2011    Social History   Tobacco Use   Smoking status: Former    Years: 10.00    Types: Cigarettes    Quit date: 04/15/2015    Years since quitting: 6.1   Smokeless tobacco: Never  Vaping Use   Vaping Use: Never used  Substance Use Topics   Alcohol use: Yes    Alcohol/week: 6.0 - 10.0 standard drinks    Types: 6 - 10 Standard drinks or equivalent per week    Comment: 3-7 airplane bottles vodka approx 3 days per week 12/26/20   Drug use: Not Currently     Medication list has been reviewed and updated.  Current Meds  Medication Sig   amLODipine (NORVASC) 10 MG tablet TAKE 1 TABLET(10 MG) BY MOUTH DAILY   atorvastatin (LIPITOR) 20 MG tablet Take 1 tablet by mouth daily.   hydrALAZINE (APRESOLINE) 10 MG tablet Take 1 tablet (10 mg total) by mouth 3 (three) times daily.   losartan (COZAAR) 100 MG tablet TAKE 1 TABLET(100 MG) BY MOUTH DAILY   omeprazole (PRILOSEC) 40 MG capsule TAKE 1 CAPSULE(40 MG) BY MOUTH EVERY MORNING   sertraline (ZOLOFT) 100 MG tablet Take 2 tablets (200 mg total) by mouth daily.    PHQ 2/9 Scores 03/29/2021 12/26/2020 09/06/2020 06/20/2020  PHQ - 2 Score 0 0 0 1  PHQ- 9 Score 0 - 8 1    GAD 7 : Generalized Anxiety Score 03/29/2021 09/06/2020 06/20/2020 05/22/2020  Nervous, Anxious, on Edge 0 1  0 0  Control/stop worrying 0 1 0 3  Worry too much - different things 0 1 0 2  Trouble relaxing 0 0 0 0  Restless 0 0 0 1  Easily annoyed or irritable 0 0 0 0  Afraid - awful might happen 0 0 0 0  Total GAD 7 Score 0 3 0 6  Anxiety Difficulty - Not difficult at all - Not difficult at all    BP Readings from Last 3 Encounters:  05/31/21 124/82  03/29/21 (!) 144/84  09/27/20 (!) 170/100    Physical Exam Vitals and nursing note reviewed.  HENT:     Head: Normocephalic.     Right Ear: External ear normal.     Left Ear: Tympanic membrane and external ear normal. There is no impacted cerumen.     Ears:     Comments: Cerumen vs chronic infection    Nose: Nose normal.  Eyes:     General: No scleral icterus.       Right eye: No  discharge.        Left eye: No discharge.     Conjunctiva/sclera: Conjunctivae normal.     Pupils: Pupils are equal, round, and reactive to light.  Neck:     Thyroid: No thyromegaly.     Vascular: No JVD.     Trachea: No tracheal deviation.  Cardiovascular:     Rate and Rhythm: Normal rate and regular rhythm.     Pulses: Normal pulses.     Heart sounds: Normal heart sounds, S1 normal and S2 normal. No murmur heard. No systolic murmur is present.  No diastolic murmur is present.    No friction rub. No gallop. No S3 or S4 sounds.  Pulmonary:     Effort: No respiratory distress.     Breath sounds: Normal breath sounds. Decreased air movement present. No decreased breath sounds, wheezing, rhonchi or rales.  Abdominal:     General: Bowel sounds are normal.     Palpations: Abdomen is soft. There is no mass.     Tenderness: There is no abdominal tenderness. There is no guarding or rebound.  Musculoskeletal:        General: No tenderness. Normal range of motion.     Cervical back: Normal range of motion and neck supple.     Right lower leg: No edema.     Left lower leg: No edema.  Lymphadenopathy:     Cervical: No cervical adenopathy.  Skin:     General: Skin is warm.     Findings: No rash.  Neurological:     Mental Status: He is alert and oriented to person, place, and time.     Cranial Nerves: No cranial nerve deficit.     Deep Tendon Reflexes: Reflexes are normal and symmetric.    Wt Readings from Last 3 Encounters:  05/31/21 197 lb (89.4 kg)  03/29/21 211 lb (95.7 kg)  09/27/20 203 lb (92.1 kg)    BP 124/82    Pulse 74    Temp 98.3 F (36.8 C) (Oral)    Ht 5' 10" (1.778 m)    Wt 197 lb (89.4 kg)    SpO2 96%    BMI 28.27 kg/m   Assessment and Plan:  1. DOE (dyspnea on exertion) Chronic.  Worsening over the past 2 weeks.  Patient has been told that he needed to quit smoking when he had a chest x-ray in [place].  It is uncertain if if what they saw was of COPD nature but I suspect.  Patient has recent onset of productive cough tinged and blood with shortness of breath increased.  We will obtain a chest x-ray.  We will initiate albuterol inhaler 2 puffs every 6 hours with AeroChamber. - DG Chest 2 View; Future - albuterol (VENTOLIN HFA) 108 (90 Base) MCG/ACT inhaler; Inhale 2 puffs into the lungs every 6 (six) hours as needed for wheezing or shortness of breath.  Dispense: 8 g; Refill: 2  2. Gastroesophageal reflux disease without esophagitis Chronic.  Controlled.  Stable.  Patient is run out of his omeprazole 40 mg once a day and this has been refilled. - omeprazole (PRILOSEC) 40 MG capsule; TAKE 1 CAPSULE(40 MG) BY MOUTH EVERY MORNING  Dispense: 90 capsule; Refill: 1  3. COPD (chronic obstructive pulmonary disease) with acute bronchitis (HCC) Chronic.  Persistent.  Stable.  With an acute exacerbation of the bronchiolitic component.  Patient has a smoking history that I think he has deemphasize him is probably more than what he has led  on but he has smoked.  We will initiate amoxicillin with clavulanic acid 875 mg twice a day.  Will obtain chest x-ray for current status of pulmonary concern.  We will recheck patient in 2  weeks - DG Chest 2 View; Future - amoxicillin-clavulanate (AUGMENTIN) 875-125 MG tablet; Take 1 tablet by mouth 2 (two) times daily.  Dispense: 20 tablet; Refill: 0

## 2021-06-03 ENCOUNTER — Telehealth: Payer: Self-pay

## 2021-06-03 ENCOUNTER — Encounter: Payer: Self-pay | Admitting: Pulmonary Disease

## 2021-06-03 ENCOUNTER — Telehealth: Payer: Self-pay | Admitting: Pulmonary Disease

## 2021-06-03 ENCOUNTER — Ambulatory Visit: Payer: Medicare PPO | Admitting: Pulmonary Disease

## 2021-06-03 ENCOUNTER — Other Ambulatory Visit: Payer: Self-pay

## 2021-06-03 VITALS — BP 142/86 | HR 75 | Temp 97.9°F | Ht 70.0 in | Wt 198.4 lb

## 2021-06-03 DIAGNOSIS — R042 Hemoptysis: Secondary | ICD-10-CM

## 2021-06-03 DIAGNOSIS — J984 Other disorders of lung: Secondary | ICD-10-CM | POA: Diagnosis not present

## 2021-06-03 DIAGNOSIS — K219 Gastro-esophageal reflux disease without esophagitis: Secondary | ICD-10-CM | POA: Diagnosis not present

## 2021-06-03 MED ORDER — AMOXICILLIN-POT CLAVULANATE 875-125 MG PO TABS: 1.0000 | ORAL_TABLET | Freq: Two times a day (BID) | ORAL | 0 refills | Status: AC

## 2021-06-03 NOTE — Patient Instructions (Signed)
We will see you back in 3 to 4 weeks time with a chest x-ray will be done the day of your follow-up.  Then we will discuss next steps.  We have sent an additional 4 days therapy to your pharmacy of the Augmentin.  This will give you a total of 14 days of Augmentin.  I suspect that what you have in your lung is a pneumonia.  A follow-up film will let us know better.

## 2021-06-03 NOTE — Telephone Encounter (Signed)
Called pt and spoke to mom due to him not being at home. The lung clinic is trying to get in touch with him to schedule appt for this afternoon. Mom stated he will be back in a minute. I expressed the importance of him calling them back immediately to get an appt. Mom says she will call her daughter to get a ride lined up for him.

## 2021-06-03 NOTE — Progress Notes (Signed)
Subjective:    Patient ID: Chad Hudson, male    DOB: 1960/11/03, 61 y.o.   MRN: [id number]  Patient Care Team: Duanne Limerick, MD as PCP - General (Family Medicine) Midge Minium, MD as Consulting Physician (Gastroenterology)  Chief Complaint  Patient presents with   Consult    HPI Patient is a 61 year old former smoker (10 PY) who presents for evaluation of an abnormal chest x-ray.  He is kindly referred by Dr. Elizabeth Sauer.  Patient states that approximately 3 weeks ago he developed a "bad" cough that was productive of purulent appearing sputum and various shades of color and occasional streaky hemoptysis.  He did not have any subjective fevers or chills but had myalgias associated with this.  Some shortness of breath associated with this and some occasional wheezing.  He is not sure about weight loss but did have some issues with anorexia early on.  He was evaluated by Dr. Yetta Barre on 17 February and a chest x-ray was obtained.  This showed a masslike density/infiltrate on the right upper lobe with cavitation (see below).  He was appropriately started on Augmentin and is being referred for further evaluation.  The patient states that since he started the Augmentin he feels better.  His appetite has returned.  He is no longer having streaky hemoptysis.  Sputum appears to be clearing.  Of note the patient does have a history of gastroesophageal reflux and esophageal stricture.  He has been on PPI however had run out of this medication and this had to be refilled by Dr. Yetta Barre on the 17th.  Does not recall any recent reflux episodes prior to the issues noted above.  He does drink alcohol but states not to excess.  The patient does not endorse any other symptomatology today.  Patient is currently disabled.  He had spinal fusion in the past and states that this has disabled him.  He does live with his mother and takes care of her.  He is divorced.  No significant occupational exposure in the  past.  Review of Systems A 10 point review of systems was performed and it is as noted above otherwise negative.   Past Medical History:  Diagnosis Date   Alcoholism (HCC)    Anemia    Anxiety    Depression    GERD (gastroesophageal reflux disease)    Hyperlipidemia    Hypertension    Past Surgical History:  Procedure Laterality Date   COLONOSCOPY WITH PROPOFOL N/A 08/16/2020   Procedure: COLONOSCOPY WITH PROPOFOL;  Surgeon: Midge Minium, MD;  Location: [facility];  Service: Endoscopy;  Laterality: N/A;   ESOPHAGEAL DILATION  08/16/2020   Procedure: ESOPHAGEAL DILATION;  Surgeon: Midge Minium, MD;  Location: [facility];  Service: Endoscopy;;   ESOPHAGOGASTRODUODENOSCOPY (EGD) WITH PROPOFOL N/A 08/16/2020   Procedure: ESOPHAGOGASTRODUODENOSCOPY (EGD) WITH PROPOFOL;  Surgeon: Midge Minium, MD;  Location: [facility];  Service: Endoscopy;  Laterality: N/A;   GIVENS CAPSULE STUDY N/A 09/18/2020   Procedure: GIVENS CAPSULE STUDY;  Surgeon: Midge Minium, MD;  Location: [facility];  Service: Endoscopy;  Laterality: N/A;   HEMORRHOID SURGERY     INNER EAR SURGERY     LUMBAR FUSION  2011   Patient Active Problem List   Diagnosis Date Noted   Pharyngoesophageal dysphagia 03/29/2021   SOBOE (shortness of breath on exertion) 10/09/2020   Iron deficiency anemia due to chronic blood loss    Stricture and stenosis of esophagus    Class  1 obesity due to excess calories without serious comorbidity with body mass index (BMI) of 30.0 to 30.9 in adult 05/12/2019   Anxiety and depression 05/12/2019   Gastroesophageal reflux disease 05/12/2019   Hyperlipidemia 05/12/2019   Leukopenia 05/12/2019   Vitamin D deficiency 05/12/2019   Essential hypertension 01/07/2018   History of cholesteatoma 06/10/2012   Mixed conductive and sensorineural hearing loss of left ear 06/10/2012   Family History  Problem Relation Age of Onset   Hypertension Mother    Cancer Maternal  Grandfather    Social History   Tobacco Use   Smoking status: Former    Packs/day: 0.50    Years: 20.00    Pack years: 10.00    Types: Cigarettes    Quit date: 04/14/2020    Years since quitting: 1.1   Smokeless tobacco: Never  Substance Use Topics   Alcohol use: Yes    Alcohol/week: 6.0 - 10.0 standard drinks    Types: 6 - 10 Standard drinks or equivalent per week    Comment: 3-7 airplane bottles vodka approx 3 days per week 12/26/20   No Known Allergies  Current Meds  Medication Sig   albuterol (VENTOLIN HFA) 108 (90 Base) MCG/ACT inhaler Inhale 2 puffs into the lungs every 6 (six) hours as needed for wheezing or shortness of breath.   amLODipine (NORVASC) 10 MG tablet TAKE 1 TABLET(10 MG) BY MOUTH DAILY   amoxicillin-clavulanate (AUGMENTIN) 875-125 MG tablet Take 1 tablet by mouth 2 (two) times daily.   atorvastatin (LIPITOR) 20 MG tablet Take 1 tablet by mouth daily.   hydrALAZINE (APRESOLINE) 10 MG tablet Take 1 tablet (10 mg total) by mouth 3 (three) times daily.   losartan (COZAAR) 100 MG tablet TAKE 1 TABLET(100 MG) BY MOUTH DAILY   omeprazole (PRILOSEC) 40 MG capsule TAKE 1 CAPSULE(40 MG) BY MOUTH EVERY MORNING   sertraline (ZOLOFT) 100 MG tablet Take 2 tablets (200 mg total) by mouth daily.   Immunization History  Administered Date(s) Administered   Influenza,inj,Quad PF,6+ Mos 06/10/2018   Influenza-Unspecified 02/17/2019, 03/12/2020, 02/24/2021   Janssen (J&J) SARS-COV-2 Vaccination 07/17/2019   Moderna Sars-Covid-2 Vaccination 03/12/2020, 02/24/2021   Tdap 05/12/2019         Objective:   Physical Exam BP (!) 142/86 (BP Location: Left Arm, Patient Position: Sitting, Cuff Size: Normal)    Pulse 75    Temp 97.9 F (36.6 C) (Oral)    Ht 5\' 10"  (1.778 m)    Wt 198 lb 6.4 oz (90 kg)    SpO2 98%    BMI 28.47 kg/m  GENERAL: Well-developed, overweight gentleman, no acute distress, fully ambulatory, no conversational dyspnea. HEAD: Normocephalic, atraumatic.  EYES:  Pupils equal, round, reactive to light.  No scleral icterus.  MOUTH: Nose/mouth/throat not examined due to masking requirements for COVID 19. NECK: Supple. No thyromegaly. Trachea midline. No JVD.  No adenopathy. PULMONARY: Good air entry bilaterally.  Occasional rhonchi, otherwise, no adventitious sounds. CARDIOVASCULAR: S1 and S2. Regular rate and rhythm.  Grade 1/6 to 2/6 systolic ejection murmur left sternal border. ABDOMEN: Benign. MUSCULOSKELETAL: No joint deformity, no clubbing, no edema.  NEUROLOGIC: No overt focal deficit, no gait disturbance, speech is fluent. SKIN: Intact,warm,dry. PSYCH: Mood and behavior normal  RADIOGRAPHIC DATA: Chest x-ray obtained 31 May 2021, reviewed with patient, shows a masslike opacity/infiltrate with central cavitation in the right upper lobe dimensions approximate 10.2 x 7.9 cm:     Assessment & Plan:     ICD-10-CM   1. Cavitary  lesion of lung  J98.4 DG Chest 2 View   DDx: Lung abscess/necrotizing pneumonia versus carcinoma Improving on antibiotics Continue antibiotics for total of 14 days Chest film on ROV    2. Cough with hemoptysis  R04.2 DG Chest 2 View   Likely due to acute infectious process Resolving with antibiotics Plan as above    3. Gastroesophageal reflux disease without esophagitis  K21.9    On PPI May have aspirated Antireflux measures     Orders Placed This Encounter  Procedures   DG Chest 2 View    Standing Status:   Future    Standing Expiration Date:   06/03/2022    Scheduling Instructions:     3-4 weeks    Order Specific Question:   Reason for Exam (SYMPTOM  OR DIAGNOSIS REQUIRED)    Answer:   cavitary lesion of lung    Order Specific Question:   Preferred imaging location?    Answer:   [facility]   Meds ordered this encounter  Medications   amoxicillin-clavulanate (AUGMENTIN) 875-125 MG tablet    Sig: Take 1 tablet by mouth 2 (two) times daily for 4 days.    Dispense:  8 tablet    Refill:  0     Additional tablets for 4 days to complete a 14-day course.   The patient has a necrotizing pneumonia/cavitary lesion in the right upper lobe.  He does have risk for aspiration.  He is improving symptomatically with Augmentin therapy.  We will continue Augmentin for a total of 14 days.  We will see the patient after Augmentin therapy is completed with chest x-ray at that time.  If the findings are persistent then we will proceed with CT scan of the chest and biopsy as necessary.  Patient is aware of the plan and is in agreement with the same.  I communicated the plan to Dr. Elizabeth Sauer via secure chat.  Gailen Shelter, MD Advanced Bronchoscopy PCCM [facility]    *This note was dictated using voice recognition software/Dragon.  Despite best efforts to proofread, errors can occur which can change the meaning. Any transcriptional errors that result from this process are unintentional and may not be fully corrected at the time of dictation.

## 2021-06-03 NOTE — Telephone Encounter (Signed)
Appt scheduled today at 2:30. Will close encounter.

## 2021-06-03 NOTE — Telephone Encounter (Signed)
Lm to offer appt today at 2:30 with Dr. Jayme Cloud to discuss CT.

## 2021-06-14 ENCOUNTER — Ambulatory Visit: Payer: Medicare PPO | Admitting: Family Medicine

## 2021-06-27 ENCOUNTER — Ambulatory Visit: Payer: Medicare PPO | Admitting: Family Medicine

## 2021-07-04 ENCOUNTER — Ambulatory Visit: Payer: Medicare PPO | Admitting: Pulmonary Disease

## 2021-08-14 ENCOUNTER — Other Ambulatory Visit: Payer: Self-pay | Admitting: Family Medicine

## 2021-08-14 DIAGNOSIS — R0609 Other forms of dyspnea: Secondary | ICD-10-CM

## 2021-08-14 NOTE — Telephone Encounter (Signed)
Requested Prescriptions  ?Pending Prescriptions Disp Refills  ?? albuterol (VENTOLIN HFA) 108 (90 Base) MCG/ACT inhaler [Pharmacy Med Name: ALBUTEROL HFA INH(200 PUFFS) 18GM] 8 g 1  ?  Sig: INHALE 2 PUFFS INTO THE LUNGS EVERY 6 HOURS AS NEEDED FOR WHEEZING OR SHORTNESS OF BREATH  ?  ? Pulmonology:  Beta Agonists 2 Failed - 08/14/2021  3:36 AM  ?  ?  Failed - Last BP in normal range  ?  BP Readings from Last 1 Encounters:  ?06/03/21 (!) 142/86  ?   ?  ?  Passed - Last Heart Rate in normal range  ?  Pulse Readings from Last 1 Encounters:  ?06/03/21 75  ?   ?  ?  Passed - Valid encounter within last 12 months  ?  Recent Outpatient Visits   ?      ? 2 months ago DOE (dyspnea on exertion)  ? [facility] Juline Patch, MD  ? 4 months ago Essential hypertension  ? [facility] Juline Patch, MD  ? 10 months ago Essential hypertension  ? [facility] Juline Patch, MD  ? 11 months ago Essential hypertension  ? [facility] Juline Patch, MD  ? 1 year ago Chest pain, unspecified type  ? [facility] Juline Patch, MD  ?  ?  ?Future Appointments   ?        ? In 1 month Tyler Pita, MD [facility]  ?  ? ?  ?  ?  ? ? ?

## 2021-09-11 ENCOUNTER — Encounter: Payer: Self-pay | Admitting: Family Medicine

## 2021-09-11 ENCOUNTER — Ambulatory Visit (INDEPENDENT_AMBULATORY_CARE_PROVIDER_SITE_OTHER): Payer: Medicare PPO | Admitting: Family Medicine

## 2021-09-11 ENCOUNTER — Other Ambulatory Visit: Payer: Self-pay

## 2021-09-11 ENCOUNTER — Other Ambulatory Visit
Admission: RE | Admit: 2021-09-11 | Discharge: 2021-09-11 | Disposition: A | Discharge status: Home / Self Care | Payer: Medicare PPO | Attending: Family Medicine | Admitting: Family Medicine

## 2021-09-11 VITALS — BP 120/80 | HR 88 | Ht 70.0 in | Wt 204.0 lb

## 2021-09-11 DIAGNOSIS — E7801 Familial hypercholesterolemia: Secondary | ICD-10-CM | POA: Insufficient documentation

## 2021-09-11 DIAGNOSIS — F32A Depression, unspecified: Secondary | ICD-10-CM | POA: Diagnosis not present

## 2021-09-11 DIAGNOSIS — K219 Gastro-esophageal reflux disease without esophagitis: Secondary | ICD-10-CM

## 2021-09-11 DIAGNOSIS — F419 Anxiety disorder, unspecified: Secondary | ICD-10-CM

## 2021-09-11 DIAGNOSIS — R7989 Other specified abnormal findings of blood chemistry: Secondary | ICD-10-CM

## 2021-09-11 DIAGNOSIS — I10 Essential (primary) hypertension: Secondary | ICD-10-CM | POA: Diagnosis not present

## 2021-09-11 LAB — COMPREHENSIVE METABOLIC PANEL
ALT: 130 U/L — ABNORMAL HIGH (ref 0–44)
AST: 190 U/L — ABNORMAL HIGH (ref 15–41)
Albumin: 4.4 g/dL (ref 3.5–5.0)
Alkaline Phosphatase: 75 U/L (ref 38–126)
Anion gap: 8 (ref 5–15)
BUN: 12 mg/dL (ref 6–20)
CO2: 26 mmol/L (ref 22–32)
Calcium: 9.4 mg/dL (ref 8.9–10.3)
Chloride: 105 mmol/L (ref 98–111)
Creatinine, Ser: 0.89 mg/dL (ref 0.61–1.24)
GFR, Estimated: >60 mL/min (ref >60)
Glucose, Bld: 93 mg/dL (ref 70–99)
Potassium: 4 mmol/L (ref 3.5–5.1)
Sodium: 139 mmol/L (ref 135–145)
Total Bilirubin: 0.3 mg/dL (ref 0.3–1.2)
Total Protein: 8 g/dL (ref 6.5–8.1)

## 2021-09-11 LAB — LIPID PANEL
Cholesterol: 193 mg/dL (ref 0–200)
HDL: 121 mg/dL (ref >40)
LDL Cholesterol: 58 mg/dL (ref 0–99)
Total CHOL/HDL Ratio: 1.6 RATIO
Triglycerides: 71 mg/dL (ref <150)
VLDL: 14 mg/dL (ref 0–40)

## 2021-09-11 MED ORDER — AMLODIPINE BESYLATE 10 MG PO TABS: ORAL_TABLET | ORAL | 1 refills | Status: DC

## 2021-09-11 MED ORDER — HYDRALAZINE HCL 10 MG PO TABS: 10.0000 mg | ORAL_TABLET | Freq: Three times a day (TID) | ORAL | 1 refills | Status: DC

## 2021-09-11 MED ORDER — LOSARTAN POTASSIUM 100 MG PO TABS: ORAL_TABLET | ORAL | 1 refills | Status: DC

## 2021-09-11 MED ORDER — OMEPRAZOLE 40 MG PO CPDR: DELAYED_RELEASE_CAPSULE | ORAL | 1 refills | Status: DC

## 2021-09-11 MED ORDER — SERTRALINE HCL 100 MG PO TABS: 200.0000 mg | ORAL_TABLET | Freq: Every day | ORAL | 1 refills | Status: DC

## 2021-09-11 NOTE — Progress Notes (Signed)
Date:  09/11/2021   Name:  Chad Hudson [facility]   DOB:  21-Mar-1961   MRN:  [id number]   Chief Complaint: Hypertension, Hyperlipidemia, Gastroesophageal Reflux, and Depression  Hypertension This is a chronic problem. The current episode started more than 1 year ago. The problem has been gradually improving since onset. The problem is controlled. Pertinent negatives include no anxiety, blurred vision, chest pain, headaches, malaise/fatigue, neck pain, orthopnea, palpitations, peripheral edema, PND, shortness of breath or sweats. Risk factors for coronary artery disease include dyslipidemia. Past treatments include angiotensin blockers, direct vasodilators and calcium channel blockers. The current treatment provides mild improvement. There are no compliance problems.  There is no history of angina, kidney disease, CAD/MI, CVA, heart failure, left ventricular hypertrophy, PVD or retinopathy. There is no history of chronic renal disease, a hypertension causing med or renovascular disease.  Hyperlipidemia This is a chronic problem. The current episode started more than 1 year ago. The problem is controlled. Recent lipid tests were reviewed and are normal. He has no history of chronic renal disease or diabetes. Pertinent negatives include no chest pain, myalgias or shortness of breath. Current antihyperlipidemic treatment includes statins. The current treatment provides moderate improvement of lipids. There are no compliance problems.  Risk factors for coronary artery disease include dyslipidemia and hypertension.  Gastroesophageal Reflux He reports no abdominal pain, no belching, no chest pain, no coughing, no dysphagia, no nausea, no sore throat or no wheezing. This is a chronic problem. The problem has been gradually improving. The symptoms are aggravated by certain foods. He has tried a PPI for the symptoms. The treatment provided moderate relief.  Depression        This is a chronic problem.  The  current episode started more than 1 year ago.   The problem occurs intermittently.  The problem has been gradually improving since onset.  Associated symptoms include no helplessness, no hopelessness, no restlessness, no myalgias, no headaches and no suicidal ideas.   Pertinent negatives include no anxiety.  Lab Results  Component Value Date   NA 142 06/20/2020   K 4.4 06/20/2020   CO2 19 (L) 06/20/2020   GLUCOSE 72 06/20/2020   BUN 13 06/20/2020   CREATININE 0.97 06/20/2020   CALCIUM 9.5 06/20/2020   EGFR 91 06/20/2020   Lab Results  Component Value Date   CHOL 243 (H) 06/20/2020   HDL 68 06/20/2020   LDLCALC 145 (H) 06/20/2020   TRIG 169 (H) 06/20/2020   No results found for: TSH No results found for: HGBA1C Lab Results  Component Value Date   WBC 9.0 06/20/2020   HGB 10.9 (L) 06/20/2020   HCT 37.7 06/20/2020   MCV 73 (L) 06/20/2020   PLT 202 06/20/2020   No results found for: ALT, AST, GGT, ALKPHOS, BILITOT No results found for: 25OHVITD2, 25OHVITD3, VD25OH   Review of Systems  Constitutional:  Negative for chills, fever and malaise/fatigue.  HENT:  Negative for drooling, ear discharge, ear pain and sore throat.   Eyes:  Negative for blurred vision.  Respiratory:  Negative for cough, shortness of breath and wheezing.   Cardiovascular:  Negative for chest pain, palpitations, orthopnea, leg swelling and PND.  Gastrointestinal:  Negative for abdominal pain, blood in stool, constipation, diarrhea, dysphagia and nausea.  Endocrine: Negative for polydipsia.  Genitourinary:  Negative for dysuria, frequency, hematuria and urgency.  Musculoskeletal:  Negative for back pain, myalgias and neck pain.  Skin:  Negative for rash.  Allergic/Immunologic: Negative for environmental  allergies.  Neurological:  Negative for dizziness and headaches.  Hematological:  Does not bruise/bleed easily.  Psychiatric/Behavioral:  Positive for depression. Negative for suicidal ideas. The patient is  not nervous/anxious.    Patient Active Problem List   Diagnosis Date Noted   Pharyngoesophageal dysphagia 03/29/2021   SOBOE (shortness of breath on exertion) 10/09/2020   Iron deficiency anemia due to chronic blood loss    Stricture and stenosis of esophagus    Class 1 obesity due to excess calories without serious comorbidity with body mass index (BMI) of 30.0 to 30.9 in adult 05/12/2019   Anxiety and depression 05/12/2019   Gastroesophageal reflux disease 05/12/2019   Hyperlipidemia 05/12/2019   Leukopenia 05/12/2019   Vitamin D deficiency 05/12/2019   Essential hypertension 01/07/2018   History of cholesteatoma 06/10/2012   Mixed conductive and sensorineural hearing loss of left ear 06/10/2012    No Known Allergies  Past Surgical History:  Procedure Laterality Date   COLONOSCOPY WITH PROPOFOL N/A 08/16/2020   Procedure: COLONOSCOPY WITH PROPOFOL;  Surgeon: Lucilla Lame, MD;  Location: [place];  Service: Endoscopy;  Laterality: N/A;   ESOPHAGEAL DILATION  08/16/2020   Procedure: ESOPHAGEAL DILATION;  Surgeon: Lucilla Lame, MD;  Location: [place];  Service: Endoscopy;;   ESOPHAGOGASTRODUODENOSCOPY (EGD) WITH PROPOFOL N/A 08/16/2020   Procedure: ESOPHAGOGASTRODUODENOSCOPY (EGD) WITH PROPOFOL;  Surgeon: Lucilla Lame, MD;  Location: [place];  Service: Endoscopy;  Laterality: N/A;   GIVENS CAPSULE STUDY N/A 09/18/2020   Procedure: GIVENS CAPSULE STUDY;  Surgeon: Lucilla Lame, MD;  Location[facility];  Service: Endoscopy;  Laterality: N/A;   HEMORRHOID SURGERY     INNER EAR SURGERY     LUMBAR FUSION  2011    Social History   Tobacco Use   Smoking status: Former    Packs/day: 0.50    Years: 20.00    Pack years: 10.00    Types: Cigarettes    Quit date: 04/14/2020    Years since quitting: 1.4   Smokeless tobacco: Never  Vaping Use   Vaping Use: Never used  Substance Use Topics   Alcohol use: Yes    Alcohol/week: 6.0 - 10.0 standard drinks     Types: 6 - 10 Standard drinks or equivalent per week    Comment: 3-7 airplane bottles vodka approx 3 days per week 12/26/20   Drug use: Not Currently     Medication list has been reviewed and updated.  Current Meds  Medication Sig   albuterol (VENTOLIN HFA) 108 (90 Base) MCG/ACT inhaler INHALE 2 PUFFS INTO THE LUNGS EVERY 6 HOURS AS NEEDED FOR WHEEZING OR SHORTNESS OF BREATH   amLODipine (NORVASC) 10 MG tablet TAKE 1 TABLET(10 MG) BY MOUTH DAILY   atorvastatin (LIPITOR) 20 MG tablet Take 1 tablet by mouth daily.   hydrALAZINE (APRESOLINE) 10 MG tablet Take 1 tablet (10 mg total) by mouth 3 (three) times daily.   losartan (COZAAR) 100 MG tablet TAKE 1 TABLET(100 MG) BY MOUTH DAILY   omeprazole (PRILOSEC) 40 MG capsule TAKE 1 CAPSULE(40 MG) BY MOUTH EVERY MORNING   sertraline (ZOLOFT) 100 MG tablet Take 2 tablets (200 mg total) by mouth daily.       09/11/2021   10:22 AM 03/29/2021   10:10 AM 09/06/2020    8:12 AM 06/20/2020    9:28 AM  GAD 7 : Generalized Anxiety Score  Nervous, Anxious, on Edge 0 0 1 0  Control/stop worrying 0 0 1 0  Worry too much -  different things 0 0 1 0  Trouble relaxing 0 0 0 0  Restless 0 0 0 0  Easily annoyed or irritable 0 0 0 0  Afraid - awful might happen 0 0 0 0  Total GAD 7 Score 0 0 3 0  Anxiety Difficulty Not difficult at all  Not difficult at all        09/11/2021   10:22 AM  Depression screen PHQ 2/9  Decreased Interest 1  Down, Depressed, Hopeless 0  PHQ - 2 Score 1  Altered sleeping 2  Tired, decreased energy 1  Change in appetite 0  Feeling bad or failure about yourself  0  Trouble concentrating 0  Moving slowly or fidgety/restless 0  Suicidal thoughts 0  PHQ-9 Score 4  Difficult doing work/chores Not difficult at all    BP Readings from Last 3 Encounters:  09/11/21 120/80  06/03/21 (!) 142/86  05/31/21 124/82    Physical Exam Vitals and nursing note reviewed.  HENT:     Head: Normocephalic.     Right Ear: Tympanic  membrane and external ear normal.     Left Ear: Tympanic membrane and external ear normal.     Nose: Nose normal. No congestion or rhinorrhea.  Eyes:     General: No scleral icterus.       Right eye: No discharge.        Left eye: No discharge.     Conjunctiva/sclera: Conjunctivae normal.     Pupils: Pupils are equal, round, and reactive to light.  Neck:     Thyroid: No thyromegaly.     Vascular: No JVD.     Trachea: No tracheal deviation.  Cardiovascular:     Rate and Rhythm: Normal rate and regular rhythm.     Heart sounds: Normal heart sounds. No murmur heard.   No friction rub. No gallop.  Pulmonary:     Effort: No respiratory distress.     Breath sounds: Normal breath sounds. No wheezing or rales.  Chest:     Chest wall: No tenderness.  Abdominal:     General: Bowel sounds are normal.     Palpations: Abdomen is soft. There is no mass.     Tenderness: There is no abdominal tenderness. There is no guarding or rebound.  Musculoskeletal:        General: No tenderness. Normal range of motion.     Cervical back: Normal range of motion and neck supple.  Lymphadenopathy:     Cervical: No cervical adenopathy.  Skin:    General: Skin is warm.     Findings: No rash.  Neurological:     Mental Status: He is alert and oriented to person, place, and time.     Cranial Nerves: No cranial nerve deficit.     Deep Tendon Reflexes: Reflexes are normal and symmetric.    Wt Readings from Last 3 Encounters:  09/11/21 204 lb (92.5 kg)  06/03/21 198 lb 6.4 oz (90 kg)  05/31/21 197 lb (89.4 kg)    BP 120/80   Pulse 88   Ht $R'5\' 10"'lY$  (1.778 m)   Wt 204 lb (92.5 kg)   BMI 29.27 kg/m   Assessment and Plan:  1. Essential hypertension Chronic.  Controlled.  Stable.  Blood pressure 120/80.  Continue amlodipine 10 mg once a day, hydralazine 10 mg 1 3 times a day, and losartan 100 mg once a day.  Will check CMP for electrolytes and GFR.  We will recheck patient in  6 months. - amLODipine  (NORVASC) 10 MG tablet; TAKE 1 TABLET(10 MG) BY MOUTH DAILY  Dispense: 90 tablet; Refill: 1 - hydrALAZINE (APRESOLINE) 10 MG tablet; Take 1 tablet (10 mg total) by mouth 3 (three) times daily.  Dispense: 270 tablet; Refill: 1 - losartan (COZAAR) 100 MG tablet; TAKE 1 TABLET(100 MG) BY MOUTH DAILY  Dispense: 90 tablet; Refill: 1 - Comprehensive Metabolic Panel (CMET)  2. Gastroesophageal reflux disease without esophagitis Chronic.  Controlled.  Stable.  Continue pravastatin 40 mg once a day. - omeprazole (PRILOSEC) 40 MG capsule; TAKE 1 CAPSULE(40 MG) BY MOUTH EVERY MORNING  Dispense: 90 capsule; Refill: 1  3. Anxiety and depression Chronic.  Controlled.  Stable.  PHQ is 4.  GAD score is 0.  Continue sertraline 100 mg 2 tablets once a day. - sertraline (ZOLOFT) 100 MG tablet; Take 2 tablets (200 mg total) by mouth daily.  Dispense: 180 tablet; Refill: 1  4. Familial hypercholesterolemia Chronic.  Controlled.  Stable.  This is diet controlled and we will continue with current treatment plan after checking lipid panel for current status of LDL. - Lipid Panel With LDL/HDL Ratio

## 2021-09-11 NOTE — Progress Notes (Signed)
US ordered

## 2021-09-16 ENCOUNTER — Ambulatory Visit: Admission: RE | Admit: 2021-09-16 | Payer: Medicare PPO | Source: Ambulatory Visit

## 2021-09-17 ENCOUNTER — Ambulatory Visit
Admission: RE | Admit: 2021-09-17 | Discharge: 2021-09-17 | Disposition: A | Discharge status: Home / Self Care | Payer: Medicare PPO | Source: Ambulatory Visit | Attending: Family Medicine | Admitting: Family Medicine

## 2021-09-17 DIAGNOSIS — R945 Abnormal results of liver function studies: Secondary | ICD-10-CM | POA: Diagnosis not present

## 2021-09-17 DIAGNOSIS — R7989 Other specified abnormal findings of blood chemistry: Secondary | ICD-10-CM | POA: Diagnosis not present

## 2021-09-19 ENCOUNTER — Ambulatory Visit
Admission: RE | Admit: 2021-09-19 | Discharge: 2021-09-19 | Disposition: A | Discharge status: Home / Self Care | Payer: Medicare PPO | Attending: Pulmonary Disease | Admitting: Pulmonary Disease

## 2021-09-19 ENCOUNTER — Encounter: Payer: Self-pay | Admitting: Pulmonary Disease

## 2021-09-19 ENCOUNTER — Ambulatory Visit: Payer: Medicare PPO | Admitting: Pulmonary Disease

## 2021-09-19 ENCOUNTER — Ambulatory Visit
Admission: RE | Admit: 2021-09-19 | Discharge: 2021-09-19 | Disposition: A | Discharge status: Home / Self Care | Payer: Medicare PPO | Source: Ambulatory Visit | Attending: Pulmonary Disease | Admitting: Pulmonary Disease

## 2021-09-19 VITALS — BP 122/90 | HR 109 | Temp 98.2°F | Ht 70.0 in | Wt 201.6 lb

## 2021-09-19 DIAGNOSIS — J852 Abscess of lung without pneumonia: Secondary | ICD-10-CM | POA: Diagnosis not present

## 2021-09-19 DIAGNOSIS — J984 Other disorders of lung: Secondary | ICD-10-CM | POA: Diagnosis not present

## 2021-09-19 DIAGNOSIS — R042 Hemoptysis: Secondary | ICD-10-CM

## 2021-09-19 DIAGNOSIS — K219 Gastro-esophageal reflux disease without esophagitis: Secondary | ICD-10-CM | POA: Diagnosis not present

## 2021-09-19 DIAGNOSIS — K449 Diaphragmatic hernia without obstruction or gangrene: Secondary | ICD-10-CM | POA: Diagnosis not present

## 2021-09-19 NOTE — Patient Instructions (Signed)
We will get a chest CT to make sure that things have cleared up in the lung.  We will see you in follow-up in 3 months time.

## 2021-09-19 NOTE — Progress Notes (Signed)
Subjective:    Patient ID: Chad Hudson, male    DOB: 1960-06-14, 61 y.o.   MRN: [id number] Patient Care Team: Chad Limerick, MD as PCP - General (Family Medicine) Chad Minium, MD as Consulting Physician (Gastroenterology)  Chief Complaint  Patient presents with   Follow-up    No complaints.   HPI Chad Hudson is a 61 year old former smoker (10 PY) who presents for follow-up of an abnormal chest x-ray.  He was initially seen here on 11 June 2021, for the details of that visit please refer to that note.  Recall that the patient stated that approximately 3 weeks prior to his evaluation here he developed a "bad" cough that was productive of purulent appearing sputum and various shades of color and occasional streaky hemoptysis.  He did not have any subjective fevers or chills but had myalgias associated with this.  Some shortness of breath associated with this and some occasional wheezing.  He is not sure about weight loss but did have some issues with anorexia early on.  He was evaluated by Dr. Yetta Hudson on 17 February and a chest x-ray was obtained.  This showed a masslike density/infiltrate on the right upper lobe with cavitation (see below).  He was appropriately started on Augmentin and was referred referred for further evaluation.  The patient had marked improvement of his symptoms on Augmentin.  His anorexia and streaky hemoptysis resolved.  Sputum cleared.  He has had issues with reflux and had run out of his PPI prior to developing the above symptoms.  He does have issues with nocturnal reflux.  At his initial visit we recommended short-term follow-up with chest x-ray.  This was obtained today and was reviewed with the patient.  His chest x-ray shows marked improvement with just only mild residual density left on the right upper lobe.  The films were shown to the patient.  He does not endorse any complaints today.   Review of Systems A 10 point review of systems was performed and it is  as noted above otherwise negative.  Patient Active Problem List   Diagnosis Date Noted   Pharyngoesophageal dysphagia 03/29/2021   SOBOE (shortness of breath on exertion) 10/09/2020   Iron deficiency anemia due to chronic blood loss    Stricture and stenosis of esophagus    Class 1 obesity due to excess calories without serious comorbidity with body mass index (BMI) of 30.0 to 30.9 in adult 05/12/2019   Anxiety and depression 05/12/2019   Gastroesophageal reflux disease 05/12/2019   Hyperlipidemia 05/12/2019   Leukopenia 05/12/2019   Vitamin D deficiency 05/12/2019   Essential hypertension 01/07/2018   History of cholesteatoma 06/10/2012   Mixed conductive and sensorineural hearing loss of left ear 06/10/2012   Social History   Tobacco Use   Smoking status: Former    Packs/day: 0.50    Years: 20.00    Total pack years: 10.00    Types: Cigarettes    Quit date: 04/14/2020    Years since quitting: 1.4   Smokeless tobacco: Never  Substance Use Topics   Alcohol use: Yes    Alcohol/week: 6.0 - 10.0 standard drinks of alcohol    Types: 6 - 10 Standard drinks or equivalent per week    Comment: 3-7 airplane bottles vodka approx 3 days per week 12/26/20   No Known Allergies  Current Meds  Medication Sig   albuterol (VENTOLIN HFA) 108 (90 Base) MCG/ACT inhaler INHALE 2 PUFFS INTO THE LUNGS EVERY 6 HOURS AS  NEEDED FOR WHEEZING OR SHORTNESS OF BREATH   amLODipine (NORVASC) 10 MG tablet TAKE 1 TABLET(10 MG) BY MOUTH DAILY   atorvastatin (LIPITOR) 20 MG tablet Take 1 tablet by mouth daily.   hydrALAZINE (APRESOLINE) 10 MG tablet Take 1 tablet (10 mg total) by mouth 3 (three) times daily.   losartan (COZAAR) 100 MG tablet TAKE 1 TABLET(100 MG) BY MOUTH DAILY   omeprazole (PRILOSEC) 40 MG capsule TAKE 1 CAPSULE(40 MG) BY MOUTH EVERY MORNING   sertraline (ZOLOFT) 100 MG tablet Take 2 tablets (200 mg total) by mouth daily.   Immunization History  Administered Date(s) Administered    Influenza,inj,Quad PF,6+ Mos 06/10/2018   Influenza-Unspecified 02/17/2019, 03/12/2020, 02/24/2021   Janssen (J&J) SARS-COV-2 Vaccination 07/17/2019   Moderna Sars-Covid-2 Vaccination 03/12/2020, 02/24/2021   Tdap 05/12/2019       Objective:   Physical Exam BP 122/90 (BP Location: Left Arm, Patient Position: Sitting, Cuff Size: Large)   Pulse (!) 109   Temp 98.2 F (36.8 C) (Oral)   Ht 5\' 10"  (1.778 m)   Wt 201 lb 9.6 oz (91.4 kg)   SpO2 100%   BMI 28.93 kg/m  GENERAL: Well-developed, overweight gentleman, no acute distress, fully ambulatory, no conversational dyspnea. HEAD: Normocephalic, atraumatic.  EYES: Pupils equal, round, reactive to light.  No scleral icterus.  MOUTH: Mucosa moist, no thrush. NECK: Supple. No thyromegaly. Trachea midline. No JVD.  No adenopathy. PULMONARY: Good air entry bilaterally.  No adventitious sounds. CARDIOVASCULAR: S1 and S2. Regular rate and rhythm.  Grade 1/6 systolic ejection murmur left sternal border. ABDOMEN: Benign. MUSCULOSKELETAL: No joint deformity, no clubbing, no edema.  NEUROLOGIC: No overt focal deficit, no gait disturbance, speech is fluent. SKIN: Intact,warm,dry. PSYCH: Mood and behavior normal  RADIOGRAPHIC DATA: Chest x-ray obtained 31 May 2021, reviewed with patient, shows a masslike opacity/infiltrate with central cavitation in the right upper lobe dimensions approximate 10.2 x 7.9 cm:   Chest x-ray today showing almost complete resolution of the process on the right upper lobe with minimal residual:     Assessment & Plan:     ICD-10-CM   1. Cavitary lesion of lung  J98.4 CT CHEST WO CONTRAST   Improving radiographically CT chest to assure resolution Suspect due to aspiration pneumonia from GERD    2. Gastroesophageal reflux disease without esophagitis  K21.9    Controlled on omeprazole 40 mg daily Continue antireflux measures    3. Cough with hemoptysis - Resolved  R04.2    No further hemoptysis Cough has  resolved     Orders Placed This Encounter  Procedures   CT CHEST WO CONTRAST    Standing Status:   Future    Standing Expiration Date:   09/20/2022    Scheduling Instructions:     Next available,.    Order Specific Question:   Preferred imaging location?    Answer:   [facility]   The patient in follow-up in 3 months time he is to contact us prior to that time should any new difficulties arise.  We will call him if any further work-up needs to be after his chest CT.   Gailen Shelter, MD Advanced Bronchoscopy [facility]    *This note was dictated using voice recognition software/Dragon.  Despite best efforts to proofread, errors can occur which can change the meaning. Any transcriptional errors that result from this process are unintentional and may not be fully corrected at the time of dictation.

## 2021-09-24 ENCOUNTER — Ambulatory Visit: Admission: RE | Admit: 2021-09-24 | Payer: Medicare PPO | Source: Ambulatory Visit

## 2021-09-25 ENCOUNTER — Other Ambulatory Visit: Payer: Self-pay

## 2021-09-25 DIAGNOSIS — I10 Essential (primary) hypertension: Secondary | ICD-10-CM

## 2021-09-25 MED ORDER — AMLODIPINE BESYLATE 10 MG PO TABS: ORAL_TABLET | ORAL | 0 refills | Status: DC

## 2021-09-27 ENCOUNTER — Ambulatory Visit
Admission: RE | Admit: 2021-09-27 | Discharge: 2021-09-27 | Disposition: A | Discharge status: Home / Self Care | Payer: Medicare PPO | Source: Ambulatory Visit | Attending: Pulmonary Disease | Admitting: Pulmonary Disease

## 2021-09-27 DIAGNOSIS — R911 Solitary pulmonary nodule: Secondary | ICD-10-CM | POA: Diagnosis not present

## 2021-09-27 DIAGNOSIS — J984 Other disorders of lung: Secondary | ICD-10-CM

## 2021-09-27 DIAGNOSIS — K449 Diaphragmatic hernia without obstruction or gangrene: Secondary | ICD-10-CM | POA: Diagnosis not present

## 2021-09-30 ENCOUNTER — Other Ambulatory Visit: Payer: Self-pay

## 2021-09-30 DIAGNOSIS — K449 Diaphragmatic hernia without obstruction or gangrene: Secondary | ICD-10-CM

## 2021-11-08 ENCOUNTER — Ambulatory Visit: Payer: Medicare PPO | Admitting: Family Medicine

## 2021-11-25 ENCOUNTER — Other Ambulatory Visit: Payer: Self-pay | Admitting: Family Medicine

## 2021-11-25 DIAGNOSIS — K219 Gastro-esophageal reflux disease without esophagitis: Secondary | ICD-10-CM

## 2021-11-26 NOTE — Telephone Encounter (Signed)
last RF 09/11/21 #90 1 RF   Requested Prescriptions  Refused Prescriptions Disp Refills  . omeprazole (PRILOSEC) 40 MG capsule [Pharmacy Med Name: OMEPRAZOLE 40MG  CAPSULES] 90 capsule 1    Sig: TAKE 1 CAPSULE(40 MG) BY MOUTH EVERY MORNING     Gastroenterology: Proton Pump Inhibitors Passed - 11/25/2021  7:09 PM      Passed - Valid encounter within last 12 months    Recent Outpatient Visits          2 months ago Essential hypertension   [facility] 11/27/2021, MD   5 months ago DOE (dyspnea on exertion)   [facility] Duanne Limerick, MD   8 months ago Essential hypertension   [facility] Duanne Limerick, MD   1 year ago Essential hypertension   [facility] Duanne Limerick, MD   1 year ago Essential hypertension   [facility] Duanne Limerick, MD      Future Appointments            In 3 months Duanne Limerick, MD [facility], [facility]

## 2021-12-18 ENCOUNTER — Ambulatory Visit: Payer: Medicare PPO | Admitting: Pulmonary Disease

## 2021-12-18 ENCOUNTER — Telehealth: Payer: Self-pay | Admitting: Pulmonary Disease

## 2021-12-18 NOTE — Telephone Encounter (Signed)
GI referral placed 09/30/2021. According to referral note, [facility] was unable to get in contact with patient.  Spoke to patient and provided him with [facility]'s contact number.  Nothing further needed.

## 2021-12-20 ENCOUNTER — Ambulatory Visit: Payer: Medicare PPO | Admitting: Pulmonary Disease

## 2021-12-30 ENCOUNTER — Ambulatory Visit: Payer: Medicare PPO

## 2021-12-31 ENCOUNTER — Encounter: Payer: Self-pay | Admitting: Emergency Medicine

## 2021-12-31 ENCOUNTER — Other Ambulatory Visit: Payer: Self-pay

## 2021-12-31 ENCOUNTER — Emergency Department
Admission: EM | Admit: 2021-12-31 | Discharge: 2021-12-31 | Disposition: A | Discharge status: Home / Self Care | Payer: Medicare PPO | Attending: Emergency Medicine | Admitting: Emergency Medicine

## 2021-12-31 ENCOUNTER — Emergency Department: Payer: Medicare PPO

## 2021-12-31 DIAGNOSIS — S42022A Displaced fracture of shaft of left clavicle, initial encounter for closed fracture: Secondary | ICD-10-CM | POA: Diagnosis not present

## 2021-12-31 DIAGNOSIS — W06XXXA Fall from bed, initial encounter: Secondary | ICD-10-CM | POA: Insufficient documentation

## 2021-12-31 DIAGNOSIS — I10 Essential (primary) hypertension: Secondary | ICD-10-CM | POA: Diagnosis not present

## 2021-12-31 DIAGNOSIS — S4992XA Unspecified injury of left shoulder and upper arm, initial encounter: Secondary | ICD-10-CM | POA: Diagnosis present

## 2021-12-31 DIAGNOSIS — M7989 Other specified soft tissue disorders: Secondary | ICD-10-CM | POA: Diagnosis not present

## 2021-12-31 DIAGNOSIS — S42032A Displaced fracture of lateral end of left clavicle, initial encounter for closed fracture: Secondary | ICD-10-CM | POA: Diagnosis not present

## 2021-12-31 MED ORDER — OXYCODONE-ACETAMINOPHEN 5-325 MG PO TABS
1.0000 | ORAL_TABLET | Freq: Once | ORAL | Status: AC
  Administered 2021-12-31: 1 via ORAL
  Filled 2021-12-31: qty 1

## 2021-12-31 MED ORDER — OXYCODONE-ACETAMINOPHEN 5-325 MG PO TABS
1.0000 | ORAL_TABLET | ORAL | 0 refills | Status: DC | PRN
Start: 2021-12-31 — End: 2022-03-20

## 2021-12-31 MED ORDER — BACLOFEN 10 MG PO TABS: 10.0000 mg | ORAL_TABLET | Freq: Three times a day (TID) | ORAL | 0 refills | Status: AC

## 2021-12-31 NOTE — ED Triage Notes (Signed)
Pt here with a fall and left shoulder pain. PT states he fell out of pain and now states he cannot move his left arm at all.

## 2021-12-31 NOTE — Discharge Instructions (Signed)
Follow-up with orthopedic.  Please call for an appointment.  They will need to see you in 2 to 3 weeks.  Wear the sling mainly for comfort.  Apply ice.  Return if worsening.

## 2021-12-31 NOTE — ED Provider Notes (Signed)
[facility] Provider Note    Event Date/Time   First MD Initiated Contact with Patient 12/31/21 1127     (approximate)   History   Fall and Shoulder Pain   HPI  Chad Hudson is a 61 y.o. male with history of hypertension, hyperlipidemia, anxiety, alcoholism presents emergency department after a fall.  Patient fell out of bed and landed on his left shoulder.  States its been painful and he is having muscle spasms.  Unable to lift the shoulder overhead.  No numbness or tingling.  No other injuries.  No head injury.      Physical Exam   Triage Vital Signs: ED Triage Vitals  Enc Vitals Group     BP 12/31/21 1102 (!) 178/98     Pulse Rate 12/31/21 1102 (!) 106     Resp 12/31/21 1102 19     Temp 12/31/21 1102 98 F (36.7 C)     Temp Source 12/31/21 1102 Oral     SpO2 12/31/21 1102 98 %     Weight 12/31/21 1103 200 lb (90.7 kg)     Height 12/31/21 1103 5\' 10"  (1.778 m)     Head Circumference --      Peak Flow --      Pain Score 12/31/21 1113 10     Pain Loc --      Pain Edu? --      Excl. in GC? --     Most recent vital signs: Vitals:   12/31/21 1102  BP: (!) 178/98  Pulse: (!) 106  Resp: 19  Temp: 98 F (36.7 C)  SpO2: 98%     General: Awake, no distress.   CV:  Good peripheral perfusion. regular rate and  rhythm Resp:  Normal effort.  Lungs CTA, ribs are nontender Abd:  No distention.   Other:  Left shoulder is tender along the clavicle distally, neurovascular is intact, humerus is nontender   ED Results / Procedures / Treatments   Labs (all labs ordered are listed, but only abnormal results are displayed) Labs Reviewed - No data to display   EKG     RADIOLOGY X-ray of the left clavicle and left shoulder    PROCEDURES:   Procedures   MEDICATIONS ORDERED IN ED: Medications  oxyCODONE-acetaminophen (PERCOCET/ROXICET) 5-325 MG per tablet 1 tablet (1 tablet Oral Given 12/31/21 1234)     IMPRESSION / MDM  / ASSESSMENT AND PLAN / ED COURSE  I reviewed the triage vital signs and the nursing notes.                              Differential diagnosis includes, but is not limited to, clavicle fracture, shoulder dislocation, contusion, sprain  Patient's presentation is most consistent with acute complicated illness / injury requiring diagnostic workup.   X-ray of the left shoulder independently reviewed and interpreted by me as having a distal left clavicle fracture, x-ray of the left clavicle shows the same distal fracture, confirmed by radiology  I did explain findings to the patient.  He is given Percocet while here in the ED as he is having a fair amount of pain.  He is placed in a sling.  Given prescription for Percocet and baclofen for the muscle spasms.  He is to take an over-the-counter anti-inflammatory.  Return emergency department if worsening.  Follow-up with orthopedics in 2 to 3 weeks.  Discharged in stable condition  FINAL CLINICAL IMPRESSION(S) / ED DIAGNOSES   Final diagnoses:  Closed displaced fracture of shaft of left clavicle, initial encounter     Rx / DC Orders   ED Discharge Orders          Ordered    oxyCODONE-acetaminophen (PERCOCET) 5-325 MG tablet  Every 4 hours PRN        12/31/21 1238    baclofen (LIORESAL) 10 MG tablet  3 times daily        12/31/21 1238             Note:  This document was prepared using Dragon voice recognition software and may include unintentional dictation errors.    Versie Starks, PA-C 12/31/21 1244    Duffy Bruce, MD 01/01/22 [phone]

## 2022-01-03 ENCOUNTER — Ambulatory Visit (INDEPENDENT_AMBULATORY_CARE_PROVIDER_SITE_OTHER): Payer: Medicare PPO

## 2022-01-03 DIAGNOSIS — Z0000 Encounter for general adult medical examination without abnormal findings: Secondary | ICD-10-CM

## 2022-01-03 NOTE — Progress Notes (Signed)
I connected with  [name] on 01/03/22 by a audio enabled telemedicine application and verified that I am speaking with the correct person using two identifiers.  Patient Location: Home  Provider Location: Office/Clinic  I discussed the limitations of evaluation and management by telemedicine. The patient expressed understanding and agreed to proceed.   Subjective:   Chad Hudson is a 61 y.o. male who presents for Medicare Annual/Subsequent preventive examination.  Review of Systems    Per HPI unless specifically indicated below.  Cardiac Risk Factors include: advanced age (>3555men, 67>65 women);male gender          Objective:    Today's Vitals   01/03/22 1320 01/03/22 1330  PainSc: 8  8   PainLoc: Shoulder    There is no height or weight on file to calculate BMI.     12/31/2021   11:14 AM 12/26/2020    1:41 PM 08/16/2020    8:36 AM  Advanced Directives  Does Patient Have a Medical Advance Directive? No No No  Would patient like information on creating a medical advance directive? No - Patient declined Yes (MAU/Ambulatory/Procedural Areas - Information given) No - Patient declined    Current Medications (verified) Outpatient Encounter Medications as of 01/03/2022  Medication Sig   amLODipine (NORVASC) 10 MG tablet TAKE 1 TABLET(10 MG) BY MOUTH DAILY   atorvastatin (LIPITOR) 20 MG tablet Take 1 tablet by mouth daily.   baclofen (LIORESAL) 10 MG tablet Take 1 tablet (10 mg total) by mouth 3 (three) times daily for 7 days.   losartan (COZAAR) 100 MG tablet TAKE 1 TABLET(100 MG) BY MOUTH DAILY   omeprazole (PRILOSEC) 40 MG capsule TAKE 1 CAPSULE(40 MG) BY MOUTH EVERY MORNING   oxyCODONE-acetaminophen (PERCOCET) 5-325 MG tablet Take 1 tablet by mouth every 4 (four) hours as needed for severe pain.   sertraline (ZOLOFT) 100 MG tablet Take 2 tablets (200 mg total) by mouth daily.   albuterol (VENTOLIN HFA) 108 (90 Base) MCG/ACT inhaler INHALE 2 PUFFS INTO THE  LUNGS EVERY 6 HOURS AS NEEDED FOR WHEEZING OR SHORTNESS OF BREATH (Patient not taking: Reported on 01/03/2022)   hydrALAZINE (APRESOLINE) 10 MG tablet Take 1 tablet (10 mg total) by mouth 3 (three) times daily. (Patient not taking: Reported on 01/03/2022)   No facility-administered encounter medications on file as of 01/03/2022.    Allergies (verified) Patient has no known allergies.   History: Past Medical History:  Diagnosis Date   Alcoholism (HCC)    Anemia    Anxiety    Depression    GERD (gastroesophageal reflux disease)    Hyperlipidemia    Hypertension    Past Surgical History:  Procedure Laterality Date   COLONOSCOPY WITH PROPOFOL N/A 08/16/2020   Procedure: COLONOSCOPY WITH PROPOFOL;  Surgeon: Midge MiniumWohl, Darren, MD;  [facility];  Service: Endoscopy;  Laterality: N/A;   ESOPHAGEAL DILATION  08/16/2020   Procedure: ESOPHAGEAL DILATION;  Surgeon: Midge MiniumWohl, Darren, MD;  [facility];  Service: Endoscopy;;   ESOPHAGOGASTRODUODENOSCOPY (EGD) WITH PROPOFOL N/A 08/16/2020   Procedure: ESOPHAGOGASTRODUODENOSCOPY (EGD) WITH PROPOFOL;  Surgeon: Midge MiniumWohl, Darren, MD;  [facility];  Service: Endoscopy;  Laterality: N/A;   GIVENS CAPSULE STUDY N/A 09/18/2020   Procedure: GIVENS CAPSULE STUDY;  Surgeon: Midge MiniumWohl, Darren, MD;  [facility];  Service: Endoscopy;  Laterality: N/A;   HEMORRHOID SURGERY     INNER EAR SURGERY     LUMBAR FUSION  2011   Family History  Problem Relation Age of Onset   Hypertension Mother    Cancer Maternal Grandfather    Social History   Socioeconomic History   Marital status: Divorced    Spouse name: Not on file   Number of children: 2   Years of education: Not on file   Highest education level: Not on file  Occupational History   Occupation: Disabled  Tobacco Use   Smoking status: Former    Packs/day: 0.50    Years: 20.00    Total pack years: 10.00    Types: Cigarettes    Quit date: 04/14/2020    Years  since quitting: 1.7   Smokeless tobacco: Never  Vaping Use   Vaping Use: Never used  Substance and Sexual Activity   Alcohol use: Yes    Alcohol/week: 6.0 - 10.0 standard drinks of alcohol    Types: 6 - 10 Standard drinks or equivalent per week    Comment: 3-7 airplane bottles vodka approx 3 days per week 12/26/20   Drug use: Not Currently   Sexual activity: Not Currently  Other Topics Concern   Not on file  Social History Narrative   Pt lives with his mother and helps take care of her   Social Determinants of Health   Financial Resource Strain: Low Risk  (01/03/2022)   Overall Financial Resource Strain (CARDIA)    Difficulty of Paying Living Expenses: Not hard at all  Food Insecurity: No Food Insecurity (01/03/2022)   Hunger Vital Sign    Worried About Running Out of Food in the Last Year: Never true    Ran Out of Food in the Last Year: Never true  Transportation Needs: No Transportation Needs (01/03/2022)   PRAPARE - Administrator, Civil Service (Medical): No    Lack of Transportation (Non-Medical): No  Physical Activity: Inactive (01/03/2022)   Exercise Vital Sign    Days of Exercise per Week: 0 days    Minutes of Exercise per Session: 0 min  Stress: No Stress Concern Present (01/03/2022)   [employer] of Occupational Health - Occupational Stress Questionnaire    Feeling of Stress : Not at all  Social Connections: Socially Isolated (01/03/2022)   Social Connection and Isolation Panel [NHANES]    Frequency of Communication with Friends and Family: More than three times a week    Frequency of Social Gatherings with Friends and Family: More than three times a week    Attends Religious Services: Never    Database administrator or Organizations: No    Attends Engineer, structural: Never    Marital Status: Divorced    Tobacco Counseling Counseling given: No   Clinical Intake:  Pre-visit preparation completed: No  Pain : 0-10 Pain Score: 8  Pain  Type: Acute pain Pain Location: Shoulder Pain Orientation: Left Pain Descriptors / Indicators: Aching, Shooting, Sharp Pain Onset: In the past 7 days Pain Frequency: Constant     Nutritional Status: BMI 25 -29 Overweight Nutritional Risks: None Diabetes: No  How often do you need to have someone help you when you read instructions, pamphlets, or other written materials from your doctor or pharmacy?: 1 - Never  Diabetic?No  Interpreter Needed?: No  Information entered by :: Laurel Dimmer, CMA   Activities of Daily Living    01/03/2022    1:26 PM  In your present state of health, do you have any difficulty performing the following activities:  Hearing? 1  Comment Left ear mastoid bone removed  Vision? 1  Difficulty concentrating or making decisions? 0  Walking or climbing stairs? 0  Dressing or bathing? 1  Doing errands, shopping? 0    Patient Care Team: Duanne Limerick, MD as PCP - General (Family Medicine) Midge Minium, MD as Consulting Physician (Gastroenterology)  Indicate any recent Medical Services you may have received from other than [facility] providers in the past year (date may be approximate).  Pt seen at [facility] ER on 12/31/21 for a Closed displaced fracture of left clavicle    Assessment:   This is a routine wellness examination for [employer].  Hearing/Vision screen No results found.  Dietary issues and exercise activities discussed: Current Exercise Habits: The patient does not participate in regular exercise at present, Exercise limited by: orthopedic condition(s);neurologic condition(s)  Goals Addressed             This Visit's Progress    Stay Active and Independent-Low Back Pain        Why is this important?   Regular activity or exercise is important to managing back pain.  Activity helps to keep your muscles strong.  You will sleep better and feel more relaxed.  You will have more energy and feel less stressed.  If you are not active now, start  slowly. Little changes make a big difference.  Rest, but not too much.  Stay as active as you can and listen to your body's signals.            Depression Screen    01/03/2022    1:28 PM 01/03/2022    1:24 PM 09/11/2021   10:22 AM 03/29/2021   10:10 AM 12/26/2020    1:39 PM 09/06/2020    8:12 AM 06/20/2020    9:28 AM  PHQ 2/9 Scores  PHQ - 2 Score 0 0 1 0 0 0 1  PHQ- 9 Score 0  4 0  8 1    Fall Risk    01/03/2022    1:26 PM 09/11/2021   10:22 AM 12/26/2020    1:43 PM 09/06/2020    8:12 AM 06/20/2020    9:27 AM  Fall Risk   Falls in the past year? 1 0 0 0 0  Number falls in past yr: 0 0 0 0   Injury with Fall? 1 0 0 0   Risk for fall due to : Other (Comment);Impaired balance/gait No Fall Risks No Fall Risks    Follow up Falls evaluation completed Falls evaluation completed Falls prevention discussed Falls evaluation completed Falls evaluation completed    FALL RISK PREVENTION PERTAINING TO THE HOME:  Any stairs in or around the home? No  If so, are there any without handrails?  No stairs in the home  Home free of loose throw rugs in walkways, pet beds, electrical cords, etc? Yes  Adequate lighting in your home to reduce risk of falls? Yes   ASSISTIVE DEVICES UTILIZED TO PREVENT FALLS:  Life alert? No  Use of a cane, walker or w/c? No  Grab bars in the bathroom? No  Shower chair or bench in shower? No  Elevated toilet seat or a handicapped toilet? No   TIMED UP AND GO:  Was the test performed?  virtual appt  .    Cognitive Function:        01/03/2022    1:28 PM  6CIT Screen  What Year? 0 points  What month? 0 points  What time? 0 points  Count back from 20 0 points  Months in reverse 0 points  Repeat phrase 0 points  Total Score 0 points    Immunizations Immunization History  Administered Date(s) Administered   Influenza,inj,Quad PF,6+ Mos 06/10/2018   Influenza-Unspecified 02/17/2019, 03/12/2020, 02/24/2021   Janssen (J&J) SARS-COV-2 Vaccination  07/17/2019   Moderna Sars-Covid-2 Vaccination 03/12/2020, 02/24/2021   Tdap 05/12/2019    TDAP status: Up to date  Flu Vaccine status: Due, Education has been provided regarding the importance of this vaccine. Advised may receive this vaccine at local pharmacy or Health Dept. Aware to provide a copy of the vaccination record if obtained from local pharmacy or Health Dept. Verbalized acceptance and understanding.  Pneumococcal vaccine status: Up to date  Covid-19 vaccine status: Information provided on how to obtain vaccines.   Qualifies for Shingles Vaccine? Yes   Zostavax completed No   Shingrix Completed?: No.    Education has been provided regarding the importance of this vaccine. Patient has been advised to call insurance company to determine out of pocket expense if they have not yet received this vaccine. Advised may also receive vaccine at local pharmacy or Health Dept. Verbalized acceptance and understanding.  Screening Tests Health Maintenance  Topic Date Due   Zoster Vaccines- Shingrix (1 of 2) Never done   COVID-19 Vaccine (4 - Janssen risk series) 04/21/2021   INFLUENZA VACCINE  11/12/2021   Hepatitis C Screening  09/12/2022 (Originally 10/23/1978)   HIV Screening  09/12/2022 (Originally 10/23/1975)   COLONOSCOPY (Pts 45-17yrs Insurance coverage will need to be confirmed)  08/16/2025   TETANUS/TDAP  05/11/2029   HPV VACCINES  Aged Out    Health Maintenance  Health Maintenance Due  Topic Date Due   Zoster Vaccines- Shingrix (1 of 2) Never done   COVID-19 Vaccine (4 - Janssen risk series) 04/21/2021   INFLUENZA VACCINE  11/12/2021    Colorectal cancer screening: Type of screening: Colonoscopy. Completed 08/16/20. Repeat every 5 years  Lung Cancer Screening: (Low Dose CT Chest recommended if Age 37-80 years, 30 pack-year currently smoking OR have quit w/in 15years.) does not qualify.     Additional Screening:  Hepatitis C Screening: does qualify; Postpones  08/16/25  Vision Screening: Recommended annual ophthalmology exams for early detection of glaucoma and other disorders of the eye. Is the patient up to date with their annual eye exam?  No  Who is the provider or what is the name of the office in which the patient attends annual eye exams?  If pt is not established with a provider, would they like to be referred to a provider to establish care? No .   Dental Screening: Recommended annual dental exams for proper oral hygiene  Community Resource Referral / Chronic Care Management: CRR required this visit?  No   CCM required this visit?  No      Plan:     I have personally reviewed and noted the following in the patient's chart:   Medical and social history Use of alcohol, tobacco or illicit drugs  Current medications and supplements including opioid prescriptions. Patient is currently taking opioid prescriptions. Information provided to patient regarding non-opioid alternatives. Patient advised to discuss non-opioid treatment plan with their provider. Functional ability and status Nutritional status Physical activity Advanced directives List of other physicians Hospitalizations, surgeries, and ER visits in previous 12 months Vitals Screenings to include cognitive, depression, and falls Referrals and appointments  In addition, I have reviewed and discussed with patient certain preventive protocols, quality metrics, and best practice recommendations. A written personalized  care plan for preventive services as well as general preventive health recommendations were provided to patient.    Chad Hudson , Thank you for taking time to come for your Medicare Wellness Visit. I appreciate your ongoing commitment to your health goals. Please review the following plan we discussed and let me know if I can assist you in the future.   These are the goals we discussed:  Goals      Stay Active and Independent-Low Back Pain      Why is  this important?   Regular activity or exercise is important to managing back pain.  Activity helps to keep your muscles strong.  You will sleep better and feel more relaxed.  You will have more energy and feel less stressed.  If you are not active now, start slowly. Little changes make a big difference.  Rest, but not too much.  Stay as active as you can and listen to your body's signals.             This is a list of the screening recommended for you and due dates:  Health Maintenance  Topic Date Due   Zoster (Shingles) Vaccine (1 of 2) Never done   COVID-19 Vaccine (4 - Janssen risk series) 04/21/2021   Flu Shot  11/12/2021   Hepatitis C Screening: USPSTF Recommendation to screen - Ages 18-79 yo.  09/12/2022*   HIV Screening  09/12/2022*   Colon Cancer Screening  08/16/2025   Tetanus Vaccine  05/11/2029   HPV Vaccine  Aged Out  *Topic was postponed. The date shown is not the original due date.     Lonna Cobb, CMA   01/03/2022   Nurse Notes: Approximately 40 minute Non-Face-to-Face Visit

## 2022-01-08 ENCOUNTER — Telehealth: Payer: Self-pay | Admitting: *Deleted

## 2022-01-08 DIAGNOSIS — M898X1 Other specified disorders of bone, shoulder: Secondary | ICD-10-CM | POA: Diagnosis not present

## 2022-01-08 DIAGNOSIS — S42035A Nondisplaced fracture of lateral end of left clavicle, initial encounter for closed fracture: Secondary | ICD-10-CM | POA: Diagnosis not present

## 2022-01-08 NOTE — Telephone Encounter (Signed)
     Patient  visit on 12/31/2021  at [facility] was for Broken clavicle   Have you been able to follow up with your primary care physician? Otho dr has been consulted   The patient was able to obtain any needed medicine or equipment.  Are there diet recommendations that you are having difficulty following?  Patient expresses understanding of discharge instructions and education provided has no other needs at this time.    [place] [phone] [address] , [place] [address] Email : [email or web address]

## 2022-01-09 ENCOUNTER — Telehealth: Payer: Self-pay

## 2022-01-09 NOTE — Telephone Encounter (Signed)
     Patient  visit on 9/19  at [facility] you been able to follow up with your primary care physician? YES  The patient was or was not able to obtain any needed medicine or equipment.YES  Are there diet recommendations that you are having difficulty following?NA  Patient expresses understanding of discharge instructions and education provided has no other needs at this time. [place], [facility], Care Management  [phone] [address], [place], [place] [address] Phone: [phone] Email: [email or web address]

## 2022-01-24 DIAGNOSIS — M898X1 Other specified disorders of bone, shoulder: Secondary | ICD-10-CM | POA: Diagnosis not present

## 2022-01-24 DIAGNOSIS — S42035D Nondisplaced fracture of lateral end of left clavicle, subsequent encounter for fracture with routine healing: Secondary | ICD-10-CM | POA: Diagnosis not present

## 2022-01-24 DIAGNOSIS — S42035A Nondisplaced fracture of lateral end of left clavicle, initial encounter for closed fracture: Secondary | ICD-10-CM | POA: Diagnosis not present

## 2022-03-17 ENCOUNTER — Ambulatory Visit: Payer: Medicare PPO | Admitting: Family Medicine

## 2022-03-20 ENCOUNTER — Emergency Department
Admission: EM | Admit: 2022-03-20 | Discharge: 2022-03-21 | Disposition: A | Discharge status: Home / Self Care | Payer: Medicare PPO | Attending: Emergency Medicine | Admitting: Emergency Medicine

## 2022-03-20 ENCOUNTER — Encounter: Payer: Self-pay | Admitting: Emergency Medicine

## 2022-03-20 ENCOUNTER — Other Ambulatory Visit: Payer: Self-pay

## 2022-03-20 ENCOUNTER — Ambulatory Visit (INDEPENDENT_AMBULATORY_CARE_PROVIDER_SITE_OTHER): Payer: Medicare PPO | Admitting: Family Medicine

## 2022-03-20 ENCOUNTER — Other Ambulatory Visit
Admission: RE | Admit: 2022-03-20 | Discharge: 2022-03-20 | Disposition: A | Discharge status: Home / Self Care | Payer: Medicare PPO | Source: Home / Self Care | Attending: Family Medicine | Admitting: Family Medicine

## 2022-03-20 ENCOUNTER — Encounter: Payer: Self-pay | Admitting: Family Medicine

## 2022-03-20 VITALS — BP 138/78 | HR 78 | Ht 70.0 in | Wt 196.0 lb

## 2022-03-20 DIAGNOSIS — K219 Gastro-esophageal reflux disease without esophagitis: Secondary | ICD-10-CM | POA: Diagnosis not present

## 2022-03-20 DIAGNOSIS — R1114 Bilious vomiting: Secondary | ICD-10-CM

## 2022-03-20 DIAGNOSIS — K2921 Alcoholic gastritis with bleeding: Secondary | ICD-10-CM | POA: Insufficient documentation

## 2022-03-20 DIAGNOSIS — I10 Essential (primary) hypertension: Secondary | ICD-10-CM | POA: Diagnosis not present

## 2022-03-20 DIAGNOSIS — K625 Hemorrhage of anus and rectum: Secondary | ICD-10-CM

## 2022-03-20 DIAGNOSIS — F32A Depression, unspecified: Secondary | ICD-10-CM

## 2022-03-20 DIAGNOSIS — F419 Anxiety disorder, unspecified: Secondary | ICD-10-CM | POA: Diagnosis not present

## 2022-03-20 DIAGNOSIS — K649 Unspecified hemorrhoids: Secondary | ICD-10-CM | POA: Insufficient documentation

## 2022-03-20 DIAGNOSIS — S42018D Nondisplaced fracture of sternal end of left clavicle, subsequent encounter for fracture with routine healing: Secondary | ICD-10-CM | POA: Diagnosis not present

## 2022-03-20 DIAGNOSIS — Z23 Encounter for immunization: Secondary | ICD-10-CM | POA: Diagnosis not present

## 2022-03-20 DIAGNOSIS — D509 Iron deficiency anemia, unspecified: Secondary | ICD-10-CM | POA: Insufficient documentation

## 2022-03-20 DIAGNOSIS — R1110 Vomiting, unspecified: Secondary | ICD-10-CM | POA: Insufficient documentation

## 2022-03-20 DIAGNOSIS — E876 Hypokalemia: Secondary | ICD-10-CM | POA: Diagnosis not present

## 2022-03-20 LAB — CBC WITH DIFFERENTIAL/PLATELET
Abs Immature Granulocytes: 0.04 10*3/uL (ref 0.00–0.07)
Basophils Absolute: 0.1 10*3/uL (ref 0.0–0.1)
Basophils Relative: 1 %
Eosinophils Absolute: 0 10*3/uL (ref 0.0–0.5)
Eosinophils Relative: 0 %
HCT: 30.3 % — ABNORMAL LOW (ref 39.0–52.0)
Hemoglobin: 8.2 g/dL — ABNORMAL LOW (ref 13.0–17.0)
Immature Granulocytes: 1 %
Lymphocytes Relative: 7 %
Lymphs Abs: 0.5 10*3/uL — ABNORMAL LOW (ref 0.7–4.0)
MCH: 18.5 pg — ABNORMAL LOW (ref 26.0–34.0)
MCHC: 27.1 g/dL — ABNORMAL LOW (ref 30.0–36.0)
MCV: 68.2 fL — ABNORMAL LOW (ref 80.0–100.0)
Monocytes Absolute: 0.3 10*3/uL (ref 0.1–1.0)
Monocytes Relative: 4 %
Neutro Abs: 6.5 10*3/uL (ref 1.7–7.7)
Neutrophils Relative %: 87 %
Platelets: 141 10*3/uL — ABNORMAL LOW (ref 150–400)
RBC: 4.44 MIL/uL (ref 4.22–5.81)
RDW: 20.1 % — ABNORMAL HIGH (ref 11.5–15.5)
WBC: 7.5 10*3/uL (ref 4.0–10.5)
nRBC: 0 % (ref 0.0–0.2)

## 2022-03-20 LAB — TYPE AND SCREEN
ABO/RH(D): O POS
Antibody Screen: NEGATIVE

## 2022-03-20 LAB — CBC
HCT: 29.4 % — ABNORMAL LOW (ref 39.0–52.0)
Hemoglobin: 7.8 g/dL — ABNORMAL LOW (ref 13.0–17.0)
MCH: 18.4 pg — ABNORMAL LOW (ref 26.0–34.0)
MCHC: 26.5 g/dL — ABNORMAL LOW (ref 30.0–36.0)
MCV: 69.2 fL — ABNORMAL LOW (ref 80.0–100.0)
Platelets: 127 10*3/uL — ABNORMAL LOW (ref 150–400)
RBC: 4.25 MIL/uL (ref 4.22–5.81)
RDW: 19.6 % — ABNORMAL HIGH (ref 11.5–15.5)
WBC: 7.5 10*3/uL (ref 4.0–10.5)
nRBC: 0 % (ref 0.0–0.2)

## 2022-03-20 LAB — LIPASE, BLOOD
Lipase: 31 U/L (ref 11–51)
Lipase: 31 U/L (ref 11–51)

## 2022-03-20 LAB — COMPREHENSIVE METABOLIC PANEL
ALT: 37 U/L (ref 0–44)
AST: 39 U/L (ref 15–41)
Albumin: 4.1 g/dL (ref 3.5–5.0)
Alkaline Phosphatase: 60 U/L (ref 38–126)
Anion gap: 9 (ref 5–15)
BUN: 13 mg/dL (ref 6–20)
CO2: 25 mmol/L (ref 22–32)
Calcium: 9.1 mg/dL (ref 8.9–10.3)
Chloride: 105 mmol/L (ref 98–111)
Creatinine, Ser: 0.7 mg/dL (ref 0.61–1.24)
GFR, Estimated: >60 mL/min (ref >60)
Glucose, Bld: 97 mg/dL (ref 70–99)
Potassium: 3.4 mmol/L — ABNORMAL LOW (ref 3.5–5.1)
Sodium: 139 mmol/L (ref 135–145)
Total Bilirubin: 1.1 mg/dL (ref 0.3–1.2)
Total Protein: 7.3 g/dL (ref 6.5–8.1)

## 2022-03-20 LAB — HEPATIC FUNCTION PANEL
ALT: 39 U/L (ref 0–44)
AST: 51 U/L — ABNORMAL HIGH (ref 15–41)
Albumin: 4.3 g/dL (ref 3.5–5.0)
Alkaline Phosphatase: 56 U/L (ref 38–126)
Bilirubin, Direct: 0.2 mg/dL (ref 0.0–0.2)
Indirect Bilirubin: 0.4 mg/dL (ref 0.3–0.9)
Total Bilirubin: 0.6 mg/dL (ref 0.3–1.2)
Total Protein: 7.9 g/dL (ref 6.5–8.1)

## 2022-03-20 LAB — TROPONIN I (HIGH SENSITIVITY)
Troponin I (High Sensitivity): 6 ng/L (ref <18)
Troponin I (High Sensitivity): 7 ng/L (ref <18)

## 2022-03-20 MED ORDER — ONDANSETRON HCL 4 MG PO TABS: 4.0000 mg | ORAL_TABLET | Freq: Three times a day (TID) | ORAL | 0 refills | Status: DC | PRN

## 2022-03-20 MED ORDER — LOSARTAN POTASSIUM 100 MG PO TABS: ORAL_TABLET | ORAL | 1 refills | Status: DC

## 2022-03-20 MED ORDER — OMEPRAZOLE 40 MG PO CPDR: DELAYED_RELEASE_CAPSULE | ORAL | 1 refills | Status: DC

## 2022-03-20 MED ORDER — AMLODIPINE BESYLATE 10 MG PO TABS: ORAL_TABLET | ORAL | 1 refills | Status: DC

## 2022-03-20 MED ORDER — SERTRALINE HCL 100 MG PO TABS: 200.0000 mg | ORAL_TABLET | Freq: Every day | ORAL | 1 refills | Status: DC

## 2022-03-20 MED ORDER — HYDRALAZINE HCL 10 MG PO TABS: 10.0000 mg | ORAL_TABLET | Freq: Three times a day (TID) | ORAL | 1 refills | Status: DC

## 2022-03-20 NOTE — ED Provider Triage Note (Signed)
Emergency Medicine Provider Triage Evaluation Note  Chad Hudson, a 61 y.o. male  was evaluated in triage.  Pt complains of low hemoglobin.  Patient who reports being in regular drinker, presented to his PCPs office for routine annual exam.  He had an episode of emesis in the office, and the provider did a guaiac study on the emesis, confirming some evidence of upper GI bleed.  Patient himself denies any coffee-ground emesis, melanotic stools, or bright red blood per rectum.  He does give a history of chronic hemorrhoids, and notes intermittent bleeding.  By his recollection, his hemoglobin was 10 last year.  He denies any history of of blood transfusion or critical anemia.  Review of Systems  Positive: Low Hgb Negative: Hematemesis, melena, rectal bleeding  Physical Exam  BP (!) 162/97   Pulse 86   Temp 98.5 F (36.9 C) (Oral)   Resp 18   Ht 5\' 10"  (1.778 m)   Wt 88.9 kg   SpO2 100%   BMI 28.12 kg/m  Gen:   Awake, no distress  NAD Resp:  Normal effort CTA MSK:   Moves extremities without difficulty  Other:    Medical Decision Making  Medically screening exam initiated at 6:32 PM.  Appropriate orders placed.  [name] was informed that the remainder of the evaluation will be completed by another provider, this initial triage assessment does not replace that evaluation, and the importance of remaining in the ED until their evaluation is complete.  Patient to the ED for evaluation of low hemoglobin based on his annual exam last year.  Patient presents denies any recent hematemesis, melena, or bright red blood per rectum.   [name], PA-C 03/20/22 1835

## 2022-03-20 NOTE — ED Triage Notes (Signed)
Patient arrives ambulatory by POV stating he went to PCP today for routine visit. Received call reporting low hemoglobin from lab work today. Hemoglobin 8.2. patient c/o heart burn and nausea x 1 month. Patient reports chronic hemorrhoids with intermittent bleeding.

## 2022-03-20 NOTE — Progress Notes (Signed)
Date:  03/20/2022   Name:  [facility]   DOB:  1960/06/05   MRN:  [id number]   Chief Complaint: Hypertension, Gastroesophageal Reflux, Depression, and Flu Vaccine " I feel fine, I just need to lie down." Hypertension The problem has been gradually improving since onset. The problem is controlled. Pertinent negatives include no anxiety, blurred vision, chest pain, headaches, malaise/fatigue, neck pain, orthopnea, palpitations, peripheral edema, PND, shortness of breath or sweats. Past treatments include angiotensin blockers, calcium channel blockers and direct vasodilators (hydralazine/been out).  Gastroesophageal Reflux He complains of heartburn and nausea. He reports no abdominal pain or no chest pain. The current episode started more than 1 month ago. Pertinent negatives include no fatigue.  Depression        This is a chronic problem.  The current episode started more than 1 year ago.   Associated symptoms include no decreased concentration, no fatigue, no helplessness, no hopelessness, does not have insomnia, not irritable, no restlessness, no decreased interest, no body aches, no headaches, not sad and no suicidal ideas.   Pertinent negatives include no anxiety.   Lab Results  Component Value Date   NA 139 09/11/2021   K 4.0 09/11/2021   CO2 26 09/11/2021   GLUCOSE 93 09/11/2021   BUN 12 09/11/2021   CREATININE 0.89 09/11/2021   CALCIUM 9.4 09/11/2021   EGFR 91 06/20/2020   GFRNONAA >60 09/11/2021   Lab Results  Component Value Date   CHOL 193 09/11/2021   HDL 121 09/11/2021   LDLCALC 58 09/11/2021   TRIG 71 09/11/2021   CHOLHDL 1.6 09/11/2021   No results found for: "TSH" No results found for: "HGBA1C" Lab Results  Component Value Date   WBC 9.0 06/20/2020   HGB 10.9 (L) 06/20/2020   HCT 37.7 06/20/2020   MCV 73 (L) 06/20/2020   PLT 202 06/20/2020   Lab Results  Component Value Date   ALT 130 (H) 09/11/2021   AST 190 (H) 09/11/2021   ALKPHOS 75  09/11/2021   BILITOT 0.3 09/11/2021   No results found for: "25OHVITD2", "25OHVITD3", "VD25OH"   Review of Systems  Constitutional:  Negative for fatigue and malaise/fatigue.  Eyes:  Negative for blurred vision.  Respiratory:  Negative for shortness of breath.   Cardiovascular:  Negative for chest pain, palpitations, orthopnea and PND.  Gastrointestinal:  Positive for heartburn and nausea. Negative for abdominal pain.  Musculoskeletal:  Negative for neck pain.  Neurological:  Negative for headaches.  Psychiatric/Behavioral:  Positive for depression. Negative for decreased concentration and suicidal ideas. The patient does not have insomnia.     Patient Active Problem List   Diagnosis Date Noted   Pharyngoesophageal dysphagia 03/29/2021   SOBOE (shortness of breath on exertion) 10/09/2020   Iron deficiency anemia due to chronic blood loss    Stricture and stenosis of esophagus    Class 1 obesity due to excess calories without serious comorbidity with body mass index (BMI) of 30.0 to 30.9 in adult 05/12/2019   Anxiety and depression 05/12/2019   Gastroesophageal reflux disease 05/12/2019   Hyperlipidemia 05/12/2019   Leukopenia 05/12/2019   Vitamin D deficiency 05/12/2019   Essential hypertension 01/07/2018   History of cholesteatoma 06/10/2012   Mixed conductive and sensorineural hearing loss of left ear 06/10/2012    No Known Allergies  Past Surgical History:  Procedure Laterality Date   COLONOSCOPY WITH PROPOFOL N/A 08/16/2020   Procedure: COLONOSCOPY WITH PROPOFOL;  Surgeon: Lucilla Lame, MD;  Location: [facility];  Service: Endoscopy;  Laterality: N/A;   ESOPHAGEAL DILATION  08/16/2020   Procedure: ESOPHAGEAL DILATION;  Surgeon: Lucilla Lame, MD;  Location: [place];  Service: Endoscopy;;   ESOPHAGOGASTRODUODENOSCOPY (EGD) WITH PROPOFOL N/A 08/16/2020   Procedure: ESOPHAGOGASTRODUODENOSCOPY (EGD) WITH PROPOFOL;  Surgeon: Lucilla Lame, MD;  Location: [place];  Service: Endoscopy;  Laterality: N/A;   GIVENS CAPSULE STUDY N/A 09/18/2020   Procedure: GIVENS CAPSULE STUDY;  Surgeon: Lucilla Lame, MD;  Location: [facility] END[facility];  Service: Endoscopy;  Laterality: N/A;   HEMORRHOID SURGERY     INNER EAR SURGERY     LUMBAR FUSION  2011    Social History   Tobacco Use   Smoking status: Former    Packs/day: 0.50    Years: 20.00    Total pack years: 10.00    Types: Cigarettes    Quit date: 04/14/2020    Years since quitting: 1.9   Smokeless tobacco: Never  Vaping Use   Vaping Use: Never used  Substance Use Topics   Alcohol use: Yes    Alcohol/week: 6.0 - 10.0 standard drinks of alcohol    Types: 6 - 10 Standard drinks or equivalent per week    Comment: 3-7 airplane bottles vodka approx 3 days per week 12/26/20   Drug use: Not Currently     Medication list has been reviewed and updated.  Current Meds  Medication Sig   amLODipine (NORVASC) 10 MG tablet TAKE 1 TABLET(10 MG) BY MOUTH DAILY   atorvastatin (LIPITOR) 20 MG tablet Take 1 tablet by mouth daily.   losartan (COZAAR) 100 MG tablet TAKE 1 TABLET(100 MG) BY MOUTH DAILY   omeprazole (PRILOSEC) 40 MG capsule TAKE 1 CAPSULE(40 MG) BY MOUTH EVERY MORNING   sertraline (ZOLOFT) 100 MG tablet Take 2 tablets (200 mg total) by mouth daily.       03/20/2022   10:51 AM 09/11/2021   10:22 AM 03/29/2021   10:10 AM 09/06/2020    8:12 AM  GAD 7 : Generalized Anxiety Score  Nervous, Anxious, on Edge 0 0 0 1  Control/stop worrying 0 0 0 1  Worry too much - different things 0 0 0 1  Trouble relaxing 0 0 0 0  Restless 0 0 0 0  Easily annoyed or irritable 0 0 0 0  Afraid - awful might happen 0 0 0 0  Total GAD 7 Score 0 0 0 3  Anxiety Difficulty Not difficult at all Not difficult at all  Not difficult at all       03/20/2022   10:51 AM 01/03/2022    1:28 PM 01/03/2022    1:24 PM  Depression screen PHQ 2/9  Decreased Interest 0 0 0  Down, Depressed, Hopeless 0 0 0  PHQ - 2 Score 0  0 0  Altered sleeping 0 0   Tired, decreased energy 0 0   Change in appetite 0 0   Feeling bad or failure about yourself  0 0   Trouble concentrating 0 0   Moving slowly or fidgety/restless 0 0   Suicidal thoughts 0 0   PHQ-9 Score 0 0   Difficult doing work/chores Not difficult at all Not difficult at all     BP Readings from Last 3 Encounters:  03/20/22 138/78  12/31/21 (!) 178/98  09/19/21 122/90    Physical Exam Vitals and nursing note reviewed.  Constitutional:      General: He is not irritable.    Appearance: He  is ill-appearing.  HENT:     Head: Normocephalic.     Right Ear: Tympanic membrane and external ear normal.     Left Ear: Tympanic membrane and external ear normal.     Nose: Nose normal.     Mouth/Throat:     Mouth: Mucous membranes are moist. Mucous membranes are pale.     Tongue: No lesions.     Palate: No mass.     Pharynx: Oropharynx is clear. Uvula midline.  Eyes:     General: No scleral icterus.       Right eye: No discharge.        Left eye: No discharge.     Conjunctiva/sclera: Conjunctivae normal.     Pupils: Pupils are equal, round, and reactive to light.  Neck:     Thyroid: No thyromegaly.     Trachea: Trachea normal. No tracheal deviation.  Cardiovascular:     Rate and Rhythm: Normal rate and regular rhythm.     Heart sounds: Normal heart sounds, S1 normal and S2 normal. No murmur heard.    No systolic murmur is present.     No diastolic murmur is present.     No friction rub. No gallop. No S3 or S4 sounds.  Pulmonary:     Effort: No respiratory distress.     Breath sounds: Normal breath sounds. No decreased breath sounds, wheezing, rhonchi or rales.  Abdominal:     General: Bowel sounds are normal.     Palpations: Abdomen is soft. There is no hepatomegaly, splenomegaly or mass.     Tenderness: There is no abdominal tenderness. There is no guarding or rebound.  Musculoskeletal:        General: No tenderness. Normal range of motion.      Cervical back: Full passive range of motion without pain, normal range of motion and neck supple.  Lymphadenopathy:     Cervical: No cervical adenopathy.     Right cervical: No superficial cervical adenopathy.    Left cervical: No superficial cervical adenopathy.  Skin:    General: Skin is warm.     Findings: No rash.  Neurological:     Mental Status: He is alert.     Motor: Tremor present.     Wt Readings from Last 3 Encounters:  03/20/22 196 lb (88.9 kg)  12/31/21 199 lb 15.3 oz (90.7 kg)  09/19/21 201 lb 9.6 oz (91.4 kg)    BP 138/78 (BP Location: Right Arm, Cuff Size: Large)   Pulse 78   Ht _0  (1.778 m)   Wt 196 lb (88.9 kg)   SpO2 99%   BMI 28.12 kg/m   Assessment and Plan: "I feel fine I just need to lie down.  "  1. Essential hypertension Chronic.  Controlled.  Stable.  Patient has a blood pressure today of 138/78 but previous readings were elevated.  Patient has been out of his hydralazine.  Asymptomatic.  Without dizziness on standing.  Tolerates medication.  Continue amlodipine 10 mg once a day, losartan 100 mg once a day, and hydralazine 10 mg 3 times a day. - amLODipine (NORVASC) 10 MG tablet; TAKE 1 TABLET(10 MG) BY MOUTH DAILY  Dispense: 90 tablet; Refill: 1 - losartan (COZAAR) 100 MG tablet; TAKE 1 TABLET(100 MG) BY MOUTH DAILY  Dispense: 90 tablet; Refill: 1 - hydrALAZINE (APRESOLINE) 10 MG tablet; Take 1 tablet (10 mg total) by mouth 3 (three) times daily.  Dispense: 270 tablet; Refill: 1  2. Gastroesophageal reflux  disease without esophagitis Chronic.  Controlled.  Stable.  Patient has been out of his omeprazole and we will refill this.  Patient has been taking over-the-counter dosing. - omeprazole (PRILOSEC) 40 MG capsule; TAKE 1 CAPSULE(40 MG) BY MOUTH EVERY MORNING  Dispense: 90 capsule; Refill: 1  3. Anxiety and depression Chronic.  Controlled.  Stable.  PHQ 0 GAD score is 0. - sertraline (ZOLOFT) 100 MG tablet; Take 2 tablets (200 mg total) by  mouth daily.  Dispense: 180 tablet; Refill: 1  4. Bilious vomiting with nausea New onset.  Episodic.  Patient was having nausea with vomiting in the office.  It had a pinkish tent and it was noted to be guaiac positive.  It was discussed with the patient that he may be having a upper GI bleed and would benefit from further evaluation in an ER hospital setting but patient refused.  After long discussion patient was given Zofran 4 mg to be taken on a as needed basis.  And has been instructed not to take any more Motrin that he is taking for his clavicle below.  5. Closed nondisplaced fracture of sternal end of left clavicle with routine healing, subsequent encounter New onset.  Persistent.  Patient is using over-the-counter Motrin for pain control of clavicle for an accident when he fell out of bed.  Patient has been instructed not to take any more Motrin and I am hesitant for him to take Tylenol until I have the lab values back.  6. Acute alcoholic gastritis with hemorrhage New onset.  Episodic.  Patient "I had too much to drink last night" patient began having nausea with vomiting this morning that is pinkish tinge and was noted to be guaiac positive.  Patient has been given Zofran 4 mg for the nausea but has been suggested to go to the ER or hospital setting but refuses.  Further discussion about the risk and patient is aware of this and will go if things should worsen. - ondansetron (ZOFRAN) 4 MG tablet; Take 1 tablet (4 mg total) by mouth every 8 (eight) hours as needed for nausea or vomiting.  Dispense: 20 tablet; Refill: 0  7. Need for immunization against influenza Discussed and administered. - Flu Vaccine QUAD 16moIM (Fluarix, Fluzone & Alfiuria Quad PF)   DOtilio Miu MD

## 2022-03-21 ENCOUNTER — Telehealth: Payer: Self-pay

## 2022-03-21 LAB — IRON AND TIBC
Iron: 34 ug/dL — ABNORMAL LOW (ref 45–182)
Saturation Ratios: 7 % — ABNORMAL LOW (ref 17.9–39.5)
TIBC: 512 ug/dL — ABNORMAL HIGH (ref 250–450)
UIBC: 478 ug/dL

## 2022-03-21 LAB — FERRITIN: Ferritin: 9 ng/mL — ABNORMAL LOW (ref 24–336)

## 2022-03-21 MED ORDER — VITAMIN C 250 MG PO TABS: 250.0000 mg | ORAL_TABLET | Freq: Every day | ORAL | 2 refills | Status: AC

## 2022-03-21 MED ORDER — POTASSIUM CHLORIDE CRYS ER 20 MEQ PO TBCR
20.0000 meq | EXTENDED_RELEASE_TABLET | Freq: Once | ORAL | Status: AC
  Administered 2022-03-21: 20 meq via ORAL
  Filled 2022-03-21: qty 1

## 2022-03-21 MED ORDER — FERROUS SULFATE 325 (65 FE) MG PO TABS: 325.0000 mg | ORAL_TABLET | Freq: Every day | ORAL | 2 refills | Status: AC

## 2022-03-21 NOTE — Telephone Encounter (Signed)
Transition Care Management Follow-up Telephone Call Date of discharge and from where: 12/07/2023[facility] How have you been since you were released from the hospital? "Feel fine" Any questions or concerns? No  Items Reviewed: Did the pt receive and understand the discharge instructions provided? Yes  Medications obtained and verified? Yes  Other?  Added iron    Any new allergies since your discharge? No  Dietary orders reviewed? Yes Do you have support at home?  Lives with his mom  Home Care and Equipment/Supplies: none  Functional Questionnaire: (I = Independent and D = Dependent) ADLs: I  Bathing/Dressing- I  Meal Prep- I  Eating- I  Maintaining continence- I  Transferring/Ambulation- I  Managing Meds- I  Follow up appointments reviewed:  PCP Hospital f/u appt confirmed? Yes  Scheduled to see Armida Sans 03/27/22@ 1:40. Specialist Hospital f/u appt confirmed?  He is going to call GI and oncology to schedule appts Are transportation arrangements needed? No  If their condition worsens, is the pt aware to call PCP or go to the Emergency Dept.? Yes Was the patient provided with contact information for the PCP's office or ED? Yes Was to pt encouraged to call back with questions or concerns? Yes

## 2022-03-21 NOTE — ED Provider Notes (Signed)
[facility] Provider Note    Event Date/Time   First MD Initiated Contact with Patient 03/20/22 2336     (approximate)   History   Chief Complaint Abnormal Lab   HPI  Chad Hudson is a 61 y.o. male with past medical history of hypertension, hyperlipidemia, GERD, iron deficiency anemia, and alcohol abuse who presents to the ED complaining of abnormal labs.  Patient reports that he went to his PCPs office for routine follow-up today, was told that his blood work there showed a hemoglobin of 8.2.  His PCP recommended that he come to the ED for further evaluation.  Patient reports intermittent rectal bleeding for multiple months but has not had any bleeding today.  He had 1 episode of vomiting earlier today but denies any blood in his emesis and has not had any dark tarry stools.  He attributes rectal bleeding to hemorrhoids, denies any abdominal pain.  He has previously been told that he has iron deficiency anemia, but has not been taking any iron supplement for the past year.  He denies any chest pain, shortness of breath, or lightheadedness.     Physical Exam   Triage Vital Signs: ED Triage Vitals  Enc Vitals Group     BP 03/20/22 1820 (!) 162/97     Pulse Rate 03/20/22 1819 86     Resp 03/20/22 1819 18     Temp 03/20/22 1820 98.5 F (36.9 C)     Temp Source 03/20/22 1820 Oral     SpO2 03/20/22 1819 100 %     Weight 03/20/22 1823 196 lb (88.9 kg)     Height 03/20/22 1823 5\' 10"  (1.778 m)     Head Circumference --      Peak Flow --      Pain Score 03/20/22 1819 0     Pain Loc --      Pain Edu? --      Excl. in GC? --     Most recent vital signs: Vitals:   03/20/22 2118 03/21/22 0127  BP: (!) 194/90 (!) 160/104  Pulse: 67 78  Resp: 18 15  Temp:  99 F (37.2 C)  SpO2: 100% 97%    Constitutional: Alert and oriented. Eyes: Conjunctivae are normal. Head: Atraumatic. Nose: No congestion/rhinnorhea. Mouth/Throat: Mucous membranes are  moist.  Cardiovascular: Normal rate, regular rhythm. Grossly normal heart sounds.  2+ radial pulses bilaterally. Respiratory: Normal respiratory effort.  No retractions. Lungs CTAB. Gastrointestinal: Soft and nontender. No distention.  Rectal exam with nonbleeding hemorrhoids. Musculoskeletal: No lower extremity tenderness nor edema.  Neurologic:  Normal speech and language. No gross focal neurologic deficits are appreciated.    ED Results / Procedures / Treatments   Labs (all labs ordered are listed, but only abnormal results are displayed) Labs Reviewed  COMPREHENSIVE METABOLIC PANEL - Abnormal; Notable for the following components:      Result Value   Potassium 3.4 (*)    All other components within normal limits  CBC - Abnormal; Notable for the following components:   Hemoglobin 7.8 (*)    HCT 29.4 (*)    MCV 69.2 (*)    MCH 18.4 (*)    MCHC 26.5 (*)    RDW 19.6 (*)    Platelets 127 (*)    All other components within normal limits  LIPASE, BLOOD  URINALYSIS, ROUTINE W REFLEX MICROSCOPIC  IRON AND TIBC  FERRITIN  POC OCCULT BLOOD, ED  TYPE AND SCREEN  TROPONIN I (HIGH SENSITIVITY)  TROPONIN I (HIGH SENSITIVITY)     EKG  ED ECG REPORT I, Chesley Noon, the attending physician, personally viewed and interpreted this ECG.   Date: 03/21/2022  EKG Time: 21:26  Rate: 62  Rhythm: normal sinus rhythm  Axis: Normal  Intervals:none  ST&T Change: None  PROCEDURES:  Critical Care performed: No  Procedures   MEDICATIONS ORDERED IN ED: Medications  potassium chloride SA (KLOR-CON M) CR tablet 20 mEq (20 mEq Oral Given 03/21/22 0127)     IMPRESSION / MDM / ASSESSMENT AND PLAN / ED COURSE  I reviewed the triage vital signs and the nursing notes.                              61 y.o. male with a past medical history of hypertension, hyperlipidemia, GERD, iron deficiency anemia, and alcohol abuse who presents to the ED complaining of low hemoglobin found on  routine labs by his PCP earlier today, denies associated symptoms but has had intermittent rectal bleeding.  Patient's presentation is most consistent with acute presentation with potential threat to life or bodily function.  Differential diagnosis includes, but is not limited to, acute blood loss anemia, iron deficiency anemia, rectal bleeding, lower GI bleed, hemorrhoids, liver disease.  Patient well-appearing and in no acute distress, vital signs are unremarkable and he does not appear to have any symptoms related to anemia.  Hemoglobin similar to previous here in the ED at 7.8, appears to be microcytic and likely due to iron deficiency as in the past.  He does have mild thrombocytopenia that is similar to previous, no significant leukocytosis.  Patient also noted to have mild hypokalemia which we will replete, no significant electrolyte abnormality or AKI noted.  2 sets of troponin are unremarkable, EKG shows no evidence of arrhythmia or ischemia.  Patient with no active bleeding on examination, reports longstanding history of intermittent bleeding hemorrhoids.  Do not feel transfusion is indicated at this time given he appears asymptomatic with hemoglobin greater than 7.  Patient was offered admission for observation and further workup for GI bleeding, but declines and prefers to follow-up as an outpatient.  We will start him back on iron as well as vitamin C, patient counseled to closely follow-up with his PCP for repeat labs.  He will also be provided with referral to follow-up with hematology and GI, was counseled to return to the ED for new or worsening symptoms.  Patient agrees with plan.      FINAL CLINICAL IMPRESSION(S) / ED DIAGNOSES   Final diagnoses:  Iron deficiency anemia, unspecified iron deficiency anemia type  Rectal bleeding     Rx / DC Orders   ED Discharge Orders          Ordered    ferrous sulfate 325 (65 FE) MG tablet  Daily        03/21/22 0124    vitamin C  (ASCORBIC ACID) 250 MG tablet  Daily        03/21/22 0124             Note:  This document was prepared using Dragon voice recognition software and may include unintentional dictation errors.   Chesley Noon, MD 03/21/22 [phone]

## 2022-03-27 ENCOUNTER — Other Ambulatory Visit
Admission: RE | Admit: 2022-03-27 | Discharge: 2022-03-27 | Disposition: A | Discharge status: Home / Self Care | Payer: Medicare PPO | Attending: Family Medicine | Admitting: Family Medicine

## 2022-03-27 ENCOUNTER — Ambulatory Visit: Payer: Medicare PPO | Admitting: Family Medicine

## 2022-03-27 VITALS — BP 134/86 | HR 84 | Ht 70.0 in | Wt 193.0 lb

## 2022-03-27 DIAGNOSIS — D500 Iron deficiency anemia secondary to blood loss (chronic): Secondary | ICD-10-CM

## 2022-03-27 LAB — CBC WITH DIFFERENTIAL/PLATELET
Abs Immature Granulocytes: 0.03 10*3/uL (ref 0.00–0.07)
Basophils Absolute: 0.1 10*3/uL (ref 0.0–0.1)
Basophils Relative: 1 %
Eosinophils Absolute: 0.1 10*3/uL (ref 0.0–0.5)
Eosinophils Relative: 3 %
HCT: 31.8 % — ABNORMAL LOW (ref 39.0–52.0)
Hemoglobin: 8.7 g/dL — ABNORMAL LOW (ref 13.0–17.0)
Immature Granulocytes: 1 %
Lymphocytes Relative: 19 %
Lymphs Abs: 1 10*3/uL (ref 0.7–4.0)
MCH: 19 pg — ABNORMAL LOW (ref 26.0–34.0)
MCHC: 27.4 g/dL — ABNORMAL LOW (ref 30.0–36.0)
MCV: 69.3 fL — ABNORMAL LOW (ref 80.0–100.0)
Monocytes Absolute: 0.5 10*3/uL (ref 0.1–1.0)
Monocytes Relative: 9 %
Neutro Abs: 3.7 10*3/uL (ref 1.7–7.7)
Neutrophils Relative %: 67 %
Platelets: 165 10*3/uL (ref 150–400)
RBC: 4.59 MIL/uL (ref 4.22–5.81)
RDW: 21.3 % — ABNORMAL HIGH (ref 11.5–15.5)
WBC: 5.4 10*3/uL (ref 4.0–10.5)
nRBC: 0 % (ref 0.0–0.2)

## 2022-03-27 LAB — FERRITIN: Ferritin: 8 ng/mL — ABNORMAL LOW (ref 24–336)

## 2022-03-27 NOTE — Progress Notes (Signed)
Date:  03/27/2022   Name:  Chad Hudson [facility]   DOB:  1960-08-28   MRN:  [id number]   Chief Complaint: Hospitalization Follow-up (Seen in ED on 03/20/22 and TOC done on 03/21/22- pt was supposed to set up appts with [facility] and [facility]. He says he has called one of them twice and didn't know about the other one)  Anemia Presents for follow-up visit. There has been no abdominal pain, bruising/bleeding easily, fever, light-headedness, pallor or palpitations. Signs of blood loss that are not present include hematemesis, hematochezia and melena. There are no compliance problems.     Lab Results  Component Value Date   NA 139 03/20/2022   K 3.4 (L) 03/20/2022   CO2 25 03/20/2022   GLUCOSE 97 03/20/2022   BUN 13 03/20/2022   CREATININE 0.70 03/20/2022   CALCIUM 9.1 03/20/2022   EGFR 91 06/20/2020   GFRNONAA >60 03/20/2022   Lab Results  Component Value Date   CHOL 193 09/11/2021   HDL 121 09/11/2021   LDLCALC 58 09/11/2021   TRIG 71 09/11/2021   CHOLHDL 1.6 09/11/2021   No results found for: "TSH" No results found for: "HGBA1C" Lab Results  Component Value Date   WBC 7.5 03/20/2022   HGB 7.8 (L) 03/20/2022   HCT 29.4 (L) 03/20/2022   MCV 69.2 (L) 03/20/2022   PLT 127 (L) 03/20/2022   Lab Results  Component Value Date   ALT 37 03/20/2022   AST 39 03/20/2022   ALKPHOS 60 03/20/2022   BILITOT 1.1 03/20/2022   No results found for: "25OHVITD2", "25OHVITD3", "VD25OH"   Review of Systems  Constitutional:  Negative for chills and fever.  HENT:  Negative for drooling, ear discharge, ear pain and sore throat.   Respiratory:  Negative for cough, shortness of breath and wheezing.   Cardiovascular:  Negative for chest pain, palpitations and leg swelling.  Gastrointestinal:  Negative for abdominal pain, blood in stool, constipation, diarrhea, hematemesis, hematochezia, melena and nausea.  Endocrine: Negative for polydipsia.  Genitourinary:  Negative for dysuria, frequency,  hematuria and urgency.  Musculoskeletal:  Negative for back pain, myalgias and neck pain.  Skin:  Negative for pallor and rash.  Allergic/Immunologic: Negative for environmental allergies.  Neurological:  Negative for dizziness, light-headedness and headaches.  Hematological:  Does not bruise/bleed easily.  Psychiatric/Behavioral:  Negative for suicidal ideas. The patient is not nervous/anxious.     Patient Active Problem List   Diagnosis Date Noted   Pharyngoesophageal dysphagia 03/29/2021   SOBOE (shortness of breath on exertion) 10/09/2020   Iron deficiency anemia due to chronic blood loss    Stricture and stenosis of esophagus    Class 1 obesity due to excess calories without serious comorbidity with body mass index (BMI) of 30.0 to 30.9 in adult 05/12/2019   Anxiety and depression 05/12/2019   Gastroesophageal reflux disease 05/12/2019   Hyperlipidemia 05/12/2019   Leukopenia 05/12/2019   Vitamin D deficiency 05/12/2019   Essential hypertension 01/07/2018   History of cholesteatoma 06/10/2012   Mixed conductive and sensorineural hearing loss of left ear 06/10/2012    No Known Allergies  Past Surgical History:  Procedure Laterality Date   COLONOSCOPY WITH PROPOFOL N/A 08/16/2020   Procedure: COLONOSCOPY WITH PROPOFOL;  Surgeon: Lucilla Lame, MD;  Location: [place];  Service: Endoscopy;  Laterality: N/A;   ESOPHAGEAL DILATION  08/16/2020   Procedure: ESOPHAGEAL DILATION;  Surgeon: Lucilla Lame, MD;  Location: [place];  Service: Endoscopy;;   ESOPHAGOGASTRODUODENOSCOPY (  EGD) WITH PROPOFOL N/A 08/16/2020   Procedure: ESOPHAGOGASTRODUODENOSCOPY (EGD) WITH PROPOFOL;  Surgeon: Lucilla Lame, MD;  Location: [place];  Service: Endoscopy;  Laterality: N/A;   GIVENS CAPSULE STUDY N/A 09/18/2020   Procedure: GIVENS CAPSULE STUDY;  Surgeon: Lucilla Lame, MD;  Location: [facility] ENDOSCOPY;  Service: Endoscopy;  Laterality: N/A;   HEMORRHOID SURGERY     INNER EAR  SURGERY     LUMBAR FUSION  2011    Social History   Tobacco Use   Smoking status: Former    Packs/day: 0.50    Years: 20.00    Total pack years: 10.00    Types: Cigarettes    Quit date: 04/14/2020    Years since quitting: 1.9   Smokeless tobacco: Never  Vaping Use   Vaping Use: Never used  Substance Use Topics   Alcohol use: Yes    Alcohol/week: 6.0 - 10.0 standard drinks of alcohol    Types: 6 - 10 Standard drinks or equivalent per week    Comment: 3-7 airplane bottles vodka approx 3 days per week 12/26/20   Drug use: Not Currently     Medication list has been reviewed and updated.  Current Meds  Medication Sig   albuterol (VENTOLIN HFA) 108 (90 Base) MCG/ACT inhaler INHALE 2 PUFFS INTO THE LUNGS EVERY 6 HOURS AS NEEDED FOR WHEEZING OR SHORTNESS OF BREATH   amLODipine (NORVASC) 10 MG tablet TAKE 1 TABLET(10 MG) BY MOUTH DAILY   atorvastatin (LIPITOR) 20 MG tablet Take 1 tablet by mouth daily.   ferrous sulfate 325 (65 FE) MG tablet Take 1 tablet (325 mg total) by mouth daily.   hydrALAZINE (APRESOLINE) 10 MG tablet Take 1 tablet (10 mg total) by mouth 3 (three) times daily.   losartan (COZAAR) 100 MG tablet TAKE 1 TABLET(100 MG) BY MOUTH DAILY   omeprazole (PRILOSEC) 40 MG capsule TAKE 1 CAPSULE(40 MG) BY MOUTH EVERY MORNING   ondansetron (ZOFRAN) 4 MG tablet Take 1 tablet (4 mg total) by mouth every 8 (eight) hours as needed for nausea or vomiting.   sertraline (ZOLOFT) 100 MG tablet Take 2 tablets (200 mg total) by mouth daily.   vitamin C (ASCORBIC ACID) 250 MG tablet Take 1 tablet (250 mg total) by mouth daily.       03/27/2022    1:47 PM 03/20/2022   10:51 AM 09/11/2021   10:22 AM 03/29/2021   10:10 AM  GAD 7 : Generalized Anxiety Score  Nervous, Anxious, on Edge 0 0 0 0  Control/stop worrying 0 0 0 0  Worry too much - different things 0 0 0 0  Trouble relaxing 0 0 0 0  Restless 0 0 0 0  Easily annoyed or irritable 0 0 0 0  Afraid - awful might happen 0 0 0 0   Total GAD 7 Score 0 0 0 0  Anxiety Difficulty Not difficult at all Not difficult at all Not difficult at all        03/27/2022    1:47 PM 03/20/2022   10:51 AM 01/03/2022    1:28 PM  Depression screen PHQ 2/9  Decreased Interest 0 0 0  Down, Depressed, Hopeless 0 0 0  PHQ - 2 Score 0 0 0  Altered sleeping 0 0 0  Tired, decreased energy 0 0 0  Change in appetite 0 0 0  Feeling bad or failure about yourself  0 0 0  Trouble concentrating 0 0 0  Moving slowly or fidgety/restless 0 0  0  Suicidal thoughts 0 0 0  PHQ-9 Score 0 0 0  Difficult doing work/chores Not difficult at all Not difficult at all Not difficult at all    BP Readings from Last 3 Encounters:  03/27/22 (!) 136/90  03/21/22 (!) 160/104  03/20/22 138/78    Physical Exam HENT:     Head: Normocephalic.     Right Ear: Tympanic membrane normal.     Left Ear: Tympanic membrane normal.     Nose: Nose normal.     Mouth/Throat:     Mouth: Mucous membranes are moist.  Eyes:     Pupils: Pupils are equal, round, and reactive to light.  Cardiovascular:     Rate and Rhythm: Normal rate and regular rhythm.     Heart sounds: No murmur heard.    No gallop.  Pulmonary:     Effort: No respiratory distress.     Breath sounds: No wheezing or rales.  Abdominal:     General: There is no distension.     Palpations: There is no hepatomegaly or splenomegaly.     Tenderness: There is no guarding or rebound.  Neurological:     Mental Status: He is alert.     Wt Readings from Last 3 Encounters:  03/27/22 193 lb (87.5 kg)  03/20/22 196 lb (88.9 kg)  03/20/22 196 lb (88.9 kg)    BP (!) 136/90 (BP Location: Right Arm, Cuff Size: Large)   Pulse 84   Ht _0  (1.778 m)   Wt 193 lb (87.5 kg)   SpO2 99%   BMI 27.69 kg/m   Assessment and Plan:  1. Iron deficiency anemia due to chronic blood loss Chronic.  Episodic.  Patient relates no melena, hematochezia, or hematemesis.  We will check CBC for H&H and indices as well as  check ferritin to confirm if taking sufficient amount of iron supplementation. - CBC w/Diff/Platelet - Ferritin      Otilio Miu, MD

## 2022-05-30 ENCOUNTER — Telehealth: Payer: Self-pay | Admitting: Family Medicine

## 2022-05-30 ENCOUNTER — Other Ambulatory Visit: Payer: Self-pay

## 2022-05-30 ENCOUNTER — Telehealth: Payer: Self-pay

## 2022-05-30 ENCOUNTER — Other Ambulatory Visit
Admission: RE | Admit: 2022-05-30 | Discharge: 2022-05-30 | Disposition: A | Discharge status: Home / Self Care | Payer: Medicare PPO | Attending: Family Medicine | Admitting: Family Medicine

## 2022-05-30 DIAGNOSIS — D509 Iron deficiency anemia, unspecified: Secondary | ICD-10-CM | POA: Insufficient documentation

## 2022-05-30 DIAGNOSIS — D500 Iron deficiency anemia secondary to blood loss (chronic): Secondary | ICD-10-CM

## 2022-05-30 LAB — CBC WITH DIFFERENTIAL/PLATELET
Abs Immature Granulocytes: 0.01 10*3/uL (ref 0.00–0.07)
Basophils Absolute: 0 10*3/uL (ref 0.0–0.1)
Basophils Relative: 1 %
Eosinophils Absolute: 0.1 10*3/uL (ref 0.0–0.5)
Eosinophils Relative: 2 %
HCT: 42.5 % (ref 39.0–52.0)
Hemoglobin: 12.7 g/dL — ABNORMAL LOW (ref 13.0–17.0)
Immature Granulocytes: 0 %
Lymphocytes Relative: 18 %
Lymphs Abs: 1.1 10*3/uL (ref 0.7–4.0)
MCH: 23.6 pg — ABNORMAL LOW (ref 26.0–34.0)
MCHC: 29.9 g/dL — ABNORMAL LOW (ref 30.0–36.0)
MCV: 78.8 fL — ABNORMAL LOW (ref 80.0–100.0)
Monocytes Absolute: 0.4 10*3/uL (ref 0.1–1.0)
Monocytes Relative: 8 %
Neutro Abs: 4.1 10*3/uL (ref 1.7–7.7)
Neutrophils Relative %: 71 %
Platelets: 150 10*3/uL (ref 150–400)
RBC: 5.39 MIL/uL (ref 4.22–5.81)
RDW: 20.8 % — ABNORMAL HIGH (ref 11.5–15.5)
WBC: 5.8 10*3/uL (ref 4.0–10.5)
nRBC: 0 % (ref 0.0–0.2)

## 2022-05-30 LAB — FERRITIN: Ferritin: 16 ng/mL — ABNORMAL LOW (ref 24–336)

## 2022-05-30 NOTE — Progress Notes (Signed)
PC to pt, unable to LVM, CRM created, [facility] may give results

## 2022-05-30 NOTE — Telephone Encounter (Signed)
This has been already taken care of

## 2022-05-30 NOTE — Telephone Encounter (Signed)
Pt called for lab results. Shared provider's note.  Luz Lex, CMA 05/30/2022  2:58 PM EST     PC to pt, unable to LVM, CRM created, [facility] may give results   Juline Patch, MD 05/30/2022 12:59 PM EST     Hemoglobin has significantly improved to 12.7 almost normal at 13.  Continue iron supplementation.    No questions at this time.

## 2022-05-30 NOTE — Progress Notes (Signed)
Cbc and ferritin ordered

## 2022-06-12 IMAGING — US US ABDOMEN LIMITED
1 series · 14 of 25 positions shown · non-contrast
Comparison: None Available.

CLINICAL DATA: Elevated LFTs

EXAM:
ULTRASOUND ABDOMEN LIMITED RIGHT UPPER QUADRANT

[Series 1: us abdomen limited · 0.25mm/px · 14 of 45 slices shown]
[im 1/45]
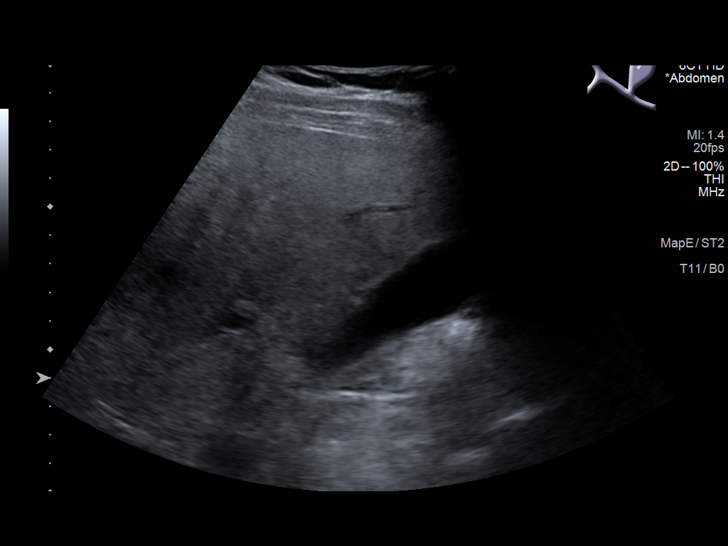
[im 4/45]
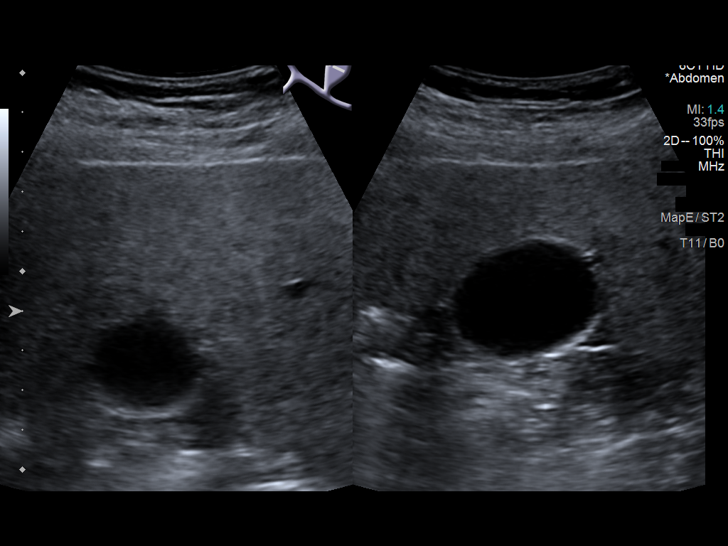
[im 8/45]
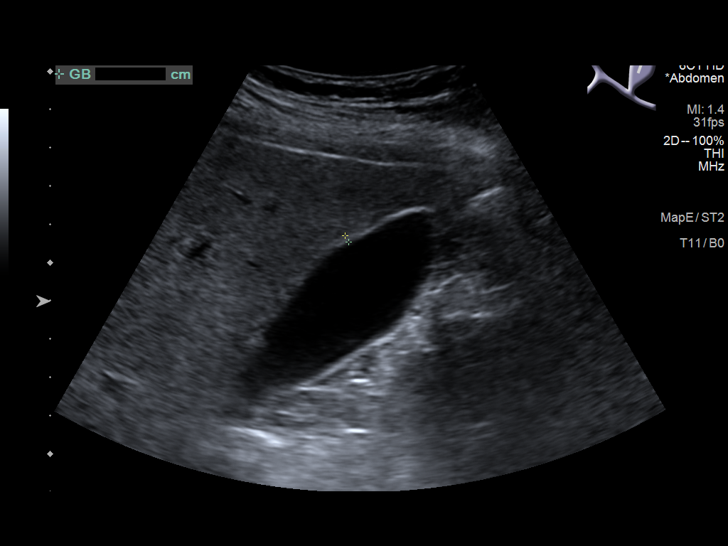
[im 12/45]
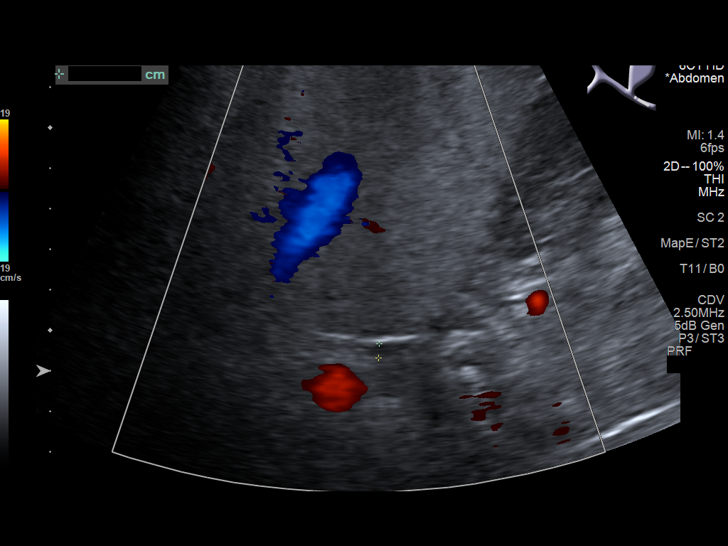
[im 15/45]
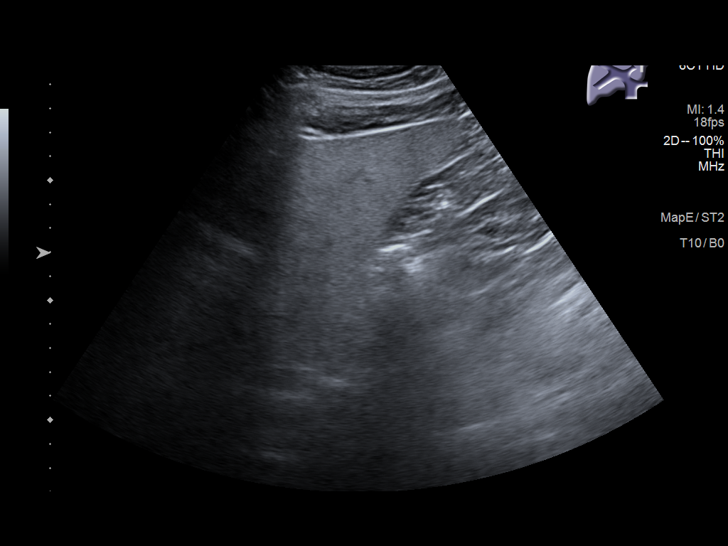
[im 17/45]
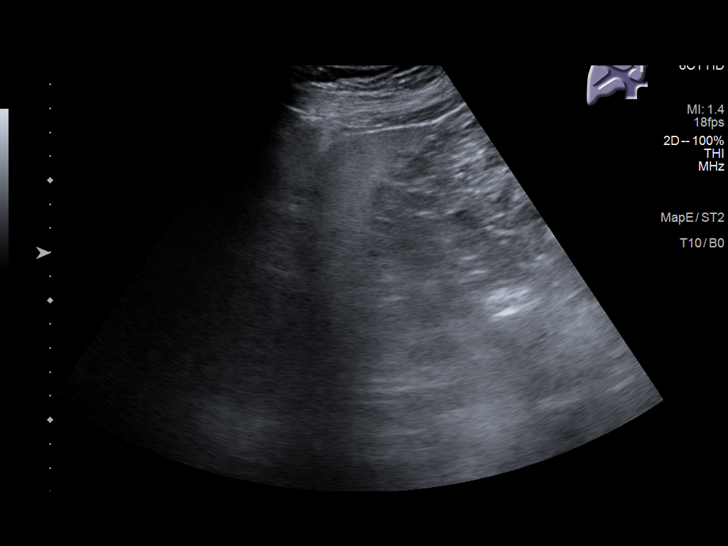
[im 21/45]
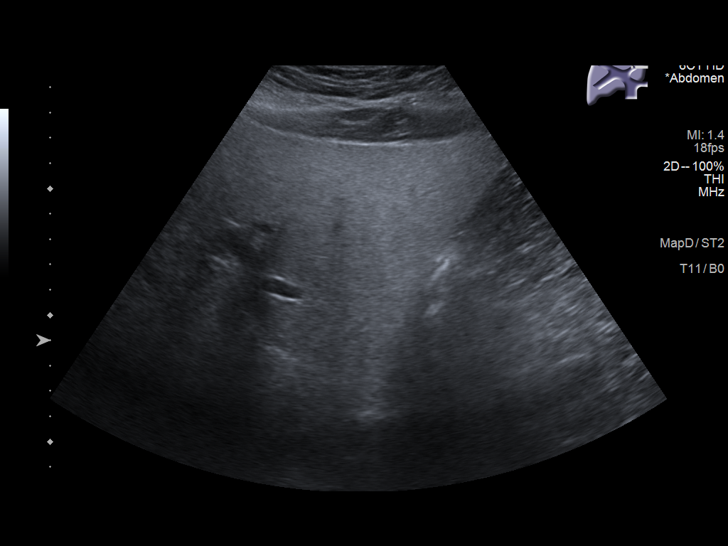
[im 24/45]
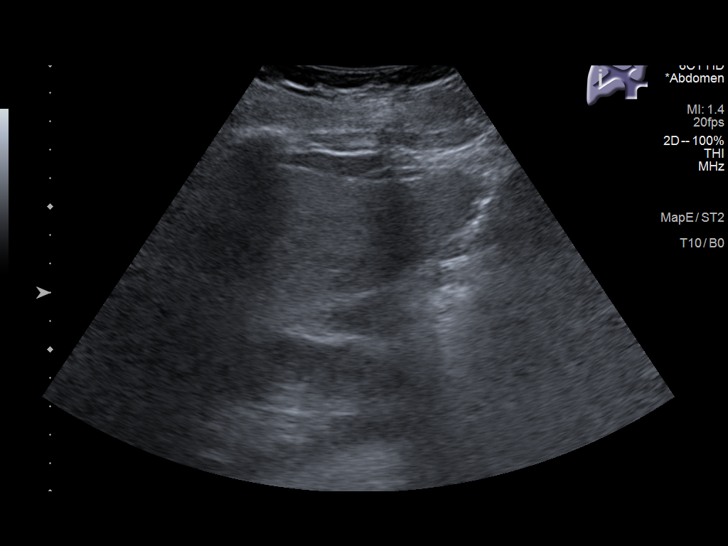
[im 28/45]
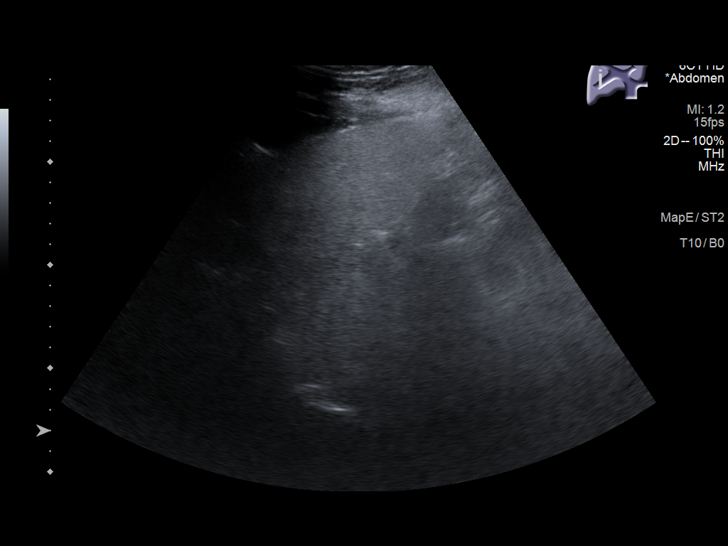
[im 30/45]
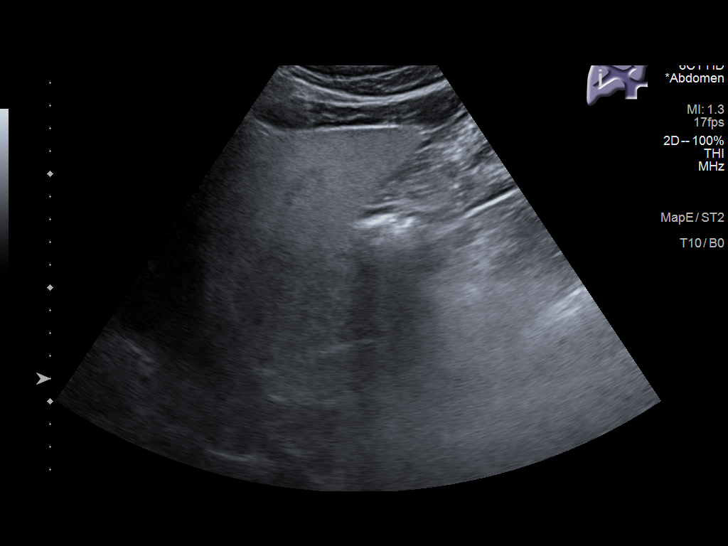
[im 34/45]
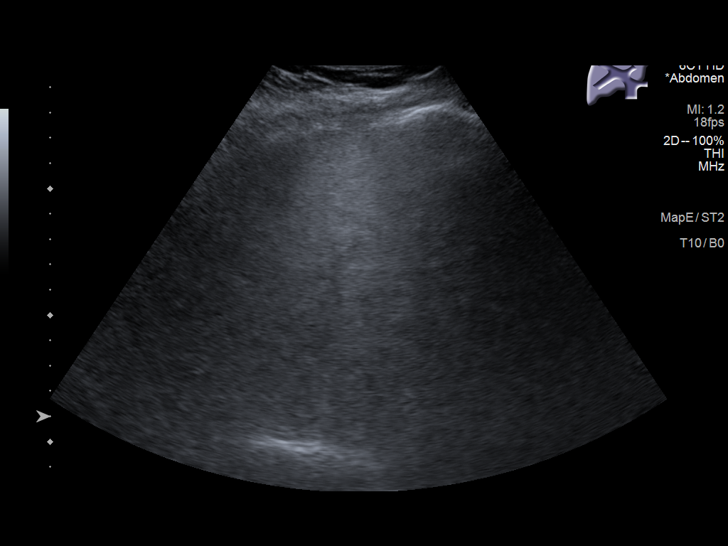
[im 37/45]
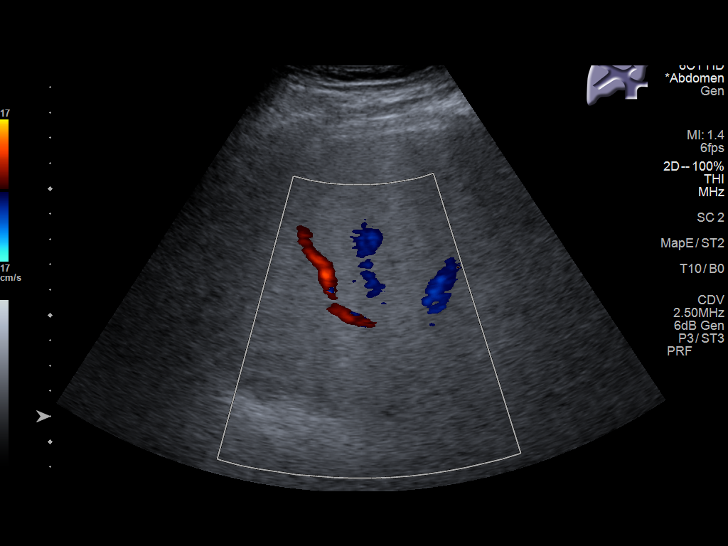
[im 41/45]
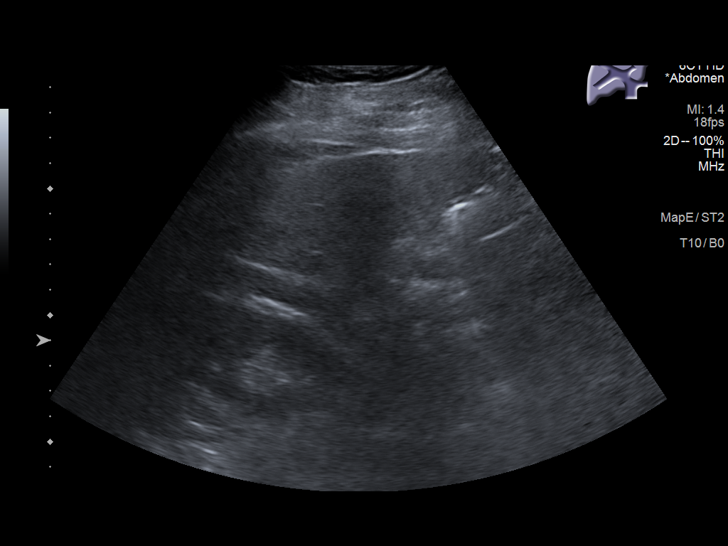
[im 45/45]
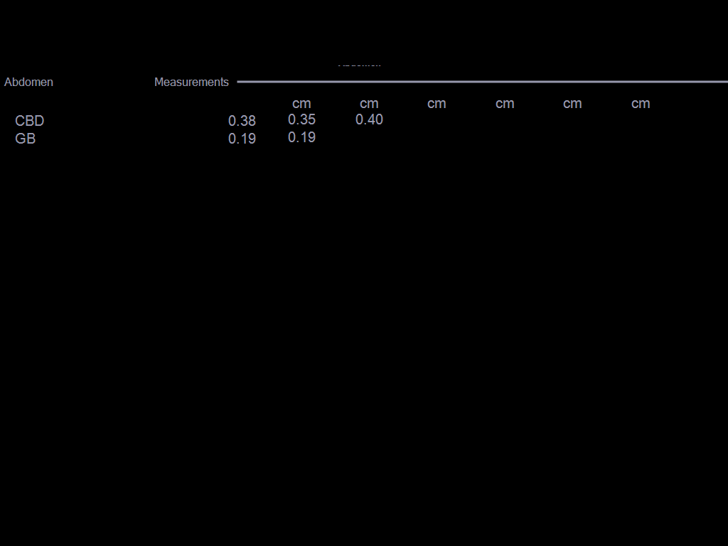

[14 of 25 positions shown; findings below may reference images not displayed]

FINDINGS: Gallbladder:

No gallstones or wall thickening visualized. No sonographic Murphy
sign noted by sonographer.

Common bile duct:

Diameter: 4 mm

Liver:

Inhomogeneous, increased echogenicity of the parenchyma with no
focal mass identified. Portal vein is patent on color Doppler
imaging with normal direction of blood flow towards the liver.

Other: None.
IMPRESSION: Abnormal appearance of the liver parenchyma suggesting hepatic
steatosis and/or other hepatocellular disease.

## 2022-06-14 IMAGING — CR DG CHEST 2V
1 series · 2 of 2 positions shown · non-contrast
Comparison: Chest x-ray May 31, 2021

CLINICAL DATA: Diagnosed with lung abscess.

EXAM:
CHEST - 2 VIEW

[Series 1: w chest pa · 0.14mm/px · 2 of 2 slices shown]
[im 1/2]
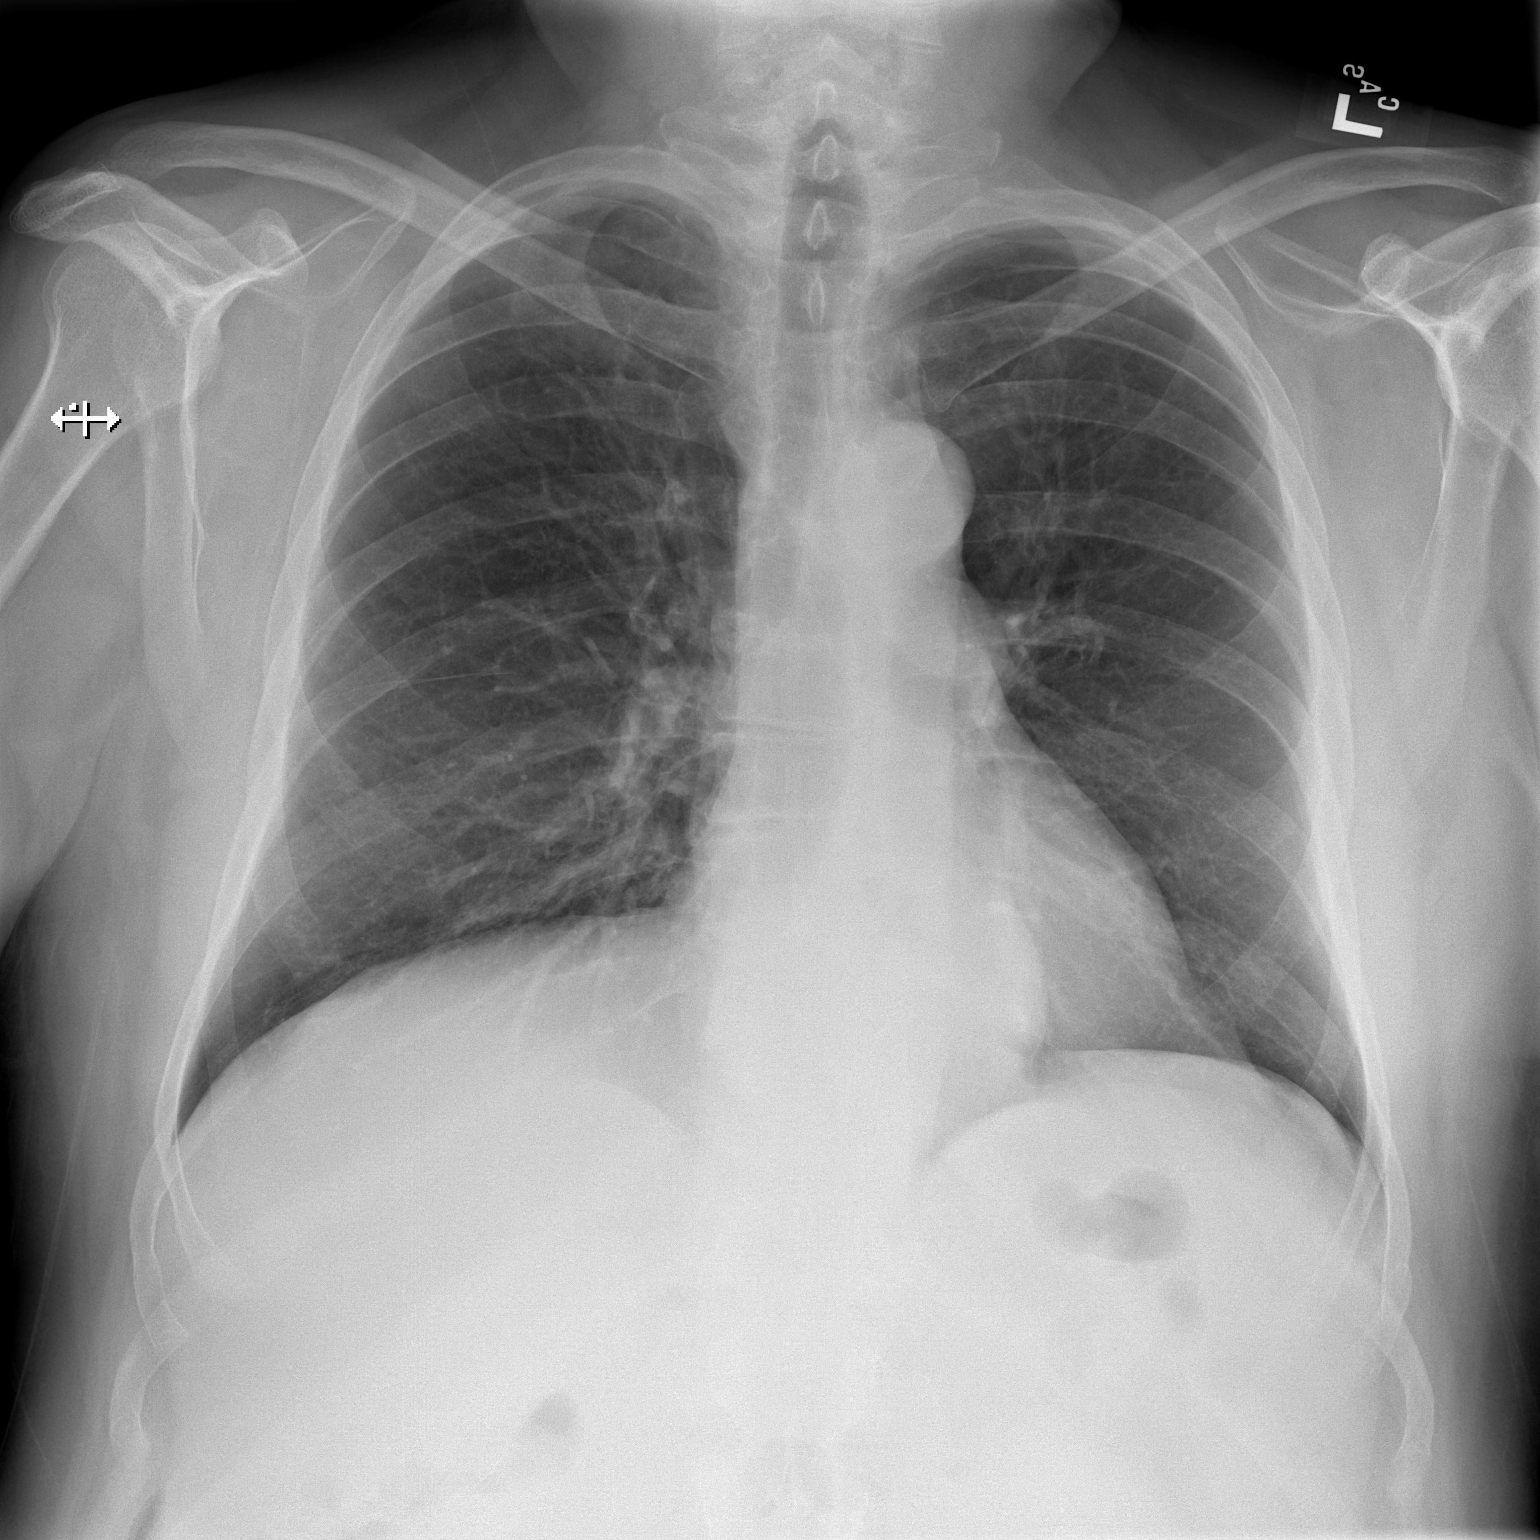
[im 2/2]
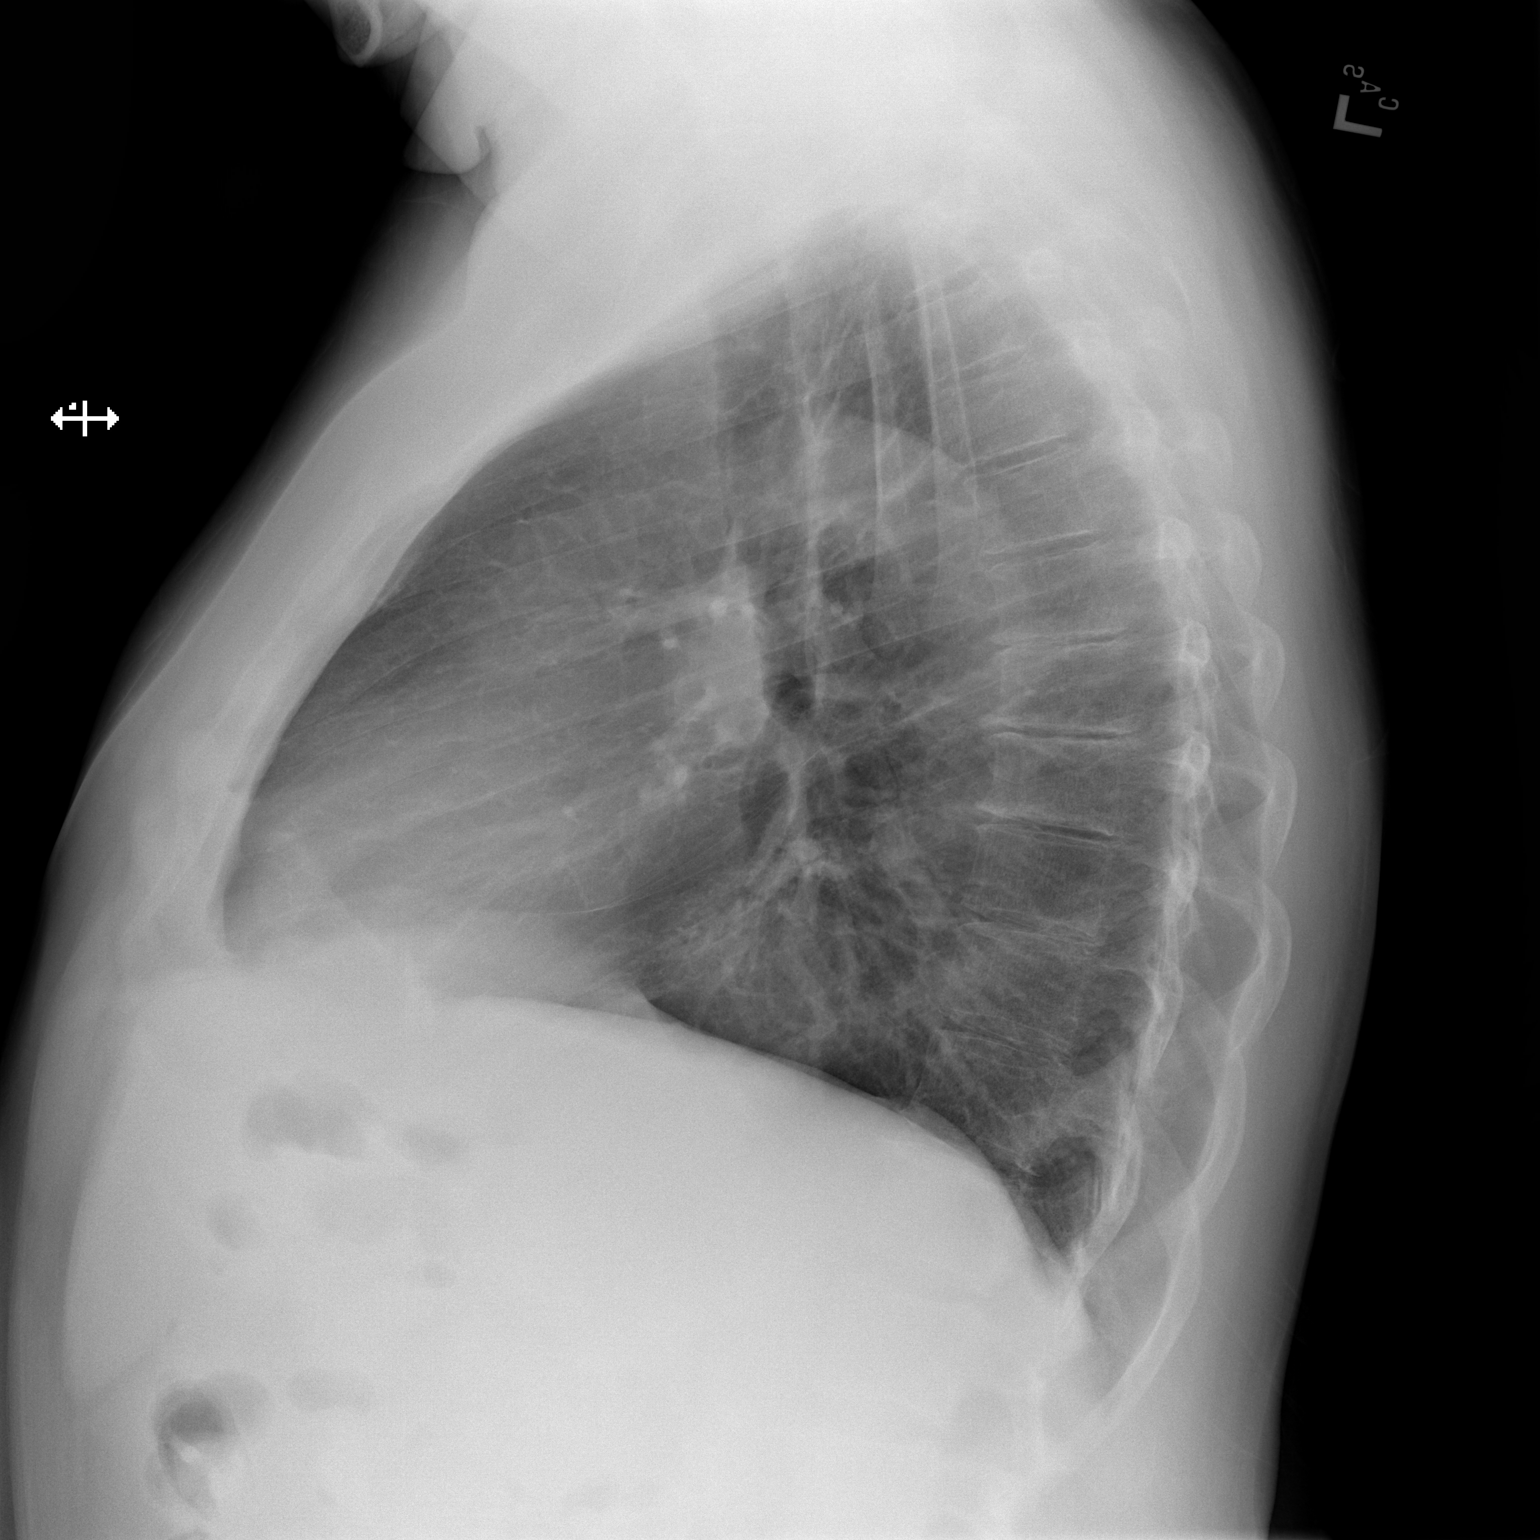

[2 of 2 positions shown; findings below may reference images not displayed]

FINDINGS: The heart size and mediastinal contours are within normal limits.
Both lungs are clear. The previously noted cavitary mass in the
right apex is not seen on current exam. The visualized skeletal
structures are unremarkable. Hiatal hernia is unchanged.
IMPRESSION: No active cardiopulmonary disease.

## 2022-08-05 ENCOUNTER — Telehealth: Payer: Self-pay | Admitting: Family Medicine

## 2022-08-05 NOTE — Telephone Encounter (Signed)
If patients calls please tell him that we had to move his appointment date up sooner, Dr Yetta Barre will be out on 06/07.

## 2022-09-12 ENCOUNTER — Other Ambulatory Visit: Payer: Self-pay | Admitting: Family Medicine

## 2022-09-12 DIAGNOSIS — F419 Anxiety disorder, unspecified: Secondary | ICD-10-CM

## 2022-09-12 DIAGNOSIS — I10 Essential (primary) hypertension: Secondary | ICD-10-CM

## 2022-09-12 DIAGNOSIS — K219 Gastro-esophageal reflux disease without esophagitis: Secondary | ICD-10-CM

## 2022-09-12 NOTE — Telephone Encounter (Signed)
Requested Prescriptions  Pending Prescriptions Disp Refills   hydrALAZINE (APRESOLINE) 10 MG tablet [Pharmacy Med Name: HYDRALAZINE 10 MG TABLETS (ORANGE)] 270 tablet 0    Sig: TAKE 1 TABLET(10 MG) BY MOUTH THREE TIMES DAILY     Cardiovascular:  Vasodilators Failed - 09/12/2022 11:21 AM      Failed - HGB in normal range and within 360 days    Hemoglobin  Date Value Ref Range Status  05/30/2022 12.7 (L) 13.0 - 17.0 g/dL Final  16/01/9603 54.0 (L) 13.0 - 17.7 g/dL Final         Failed - ANA Screen, Ifa, Serum in normal range and within 360 days    No results found for: "ANA", "ANATITER", "LABANTI"       Passed - HCT in normal range and within 360 days    HCT  Date Value Ref Range Status  05/30/2022 42.5 39.0 - 52.0 % Final   Hematocrit  Date Value Ref Range Status  06/20/2020 37.7 37.5 - 51.0 % Final         Passed - RBC in normal range and within 360 days    RBC  Date Value Ref Range Status  05/30/2022 5.39 4.22 - 5.81 MIL/uL Final         Passed - WBC in normal range and within 360 days    WBC  Date Value Ref Range Status  05/30/2022 5.8 4.0 - 10.5 K/uL Final         Passed - PLT in normal range and within 360 days    Platelets  Date Value Ref Range Status  05/30/2022 150 150 - 400 K/uL Final  06/20/2020 202 150 - 450 x10E3/uL Final         Passed - Last BP in normal range    BP Readings from Last 1 Encounters:  03/27/22 134/86         Passed - Valid encounter within last 12 months    Recent Outpatient Visits           5 months ago Iron deficiency anemia due to chronic blood loss   [place] [facility]   5 months ago Essential hypertension   [place] [facility]   1 year ago Essential hypertension   [place] [facility]   1 year ago DOE (dyspnea on exertion)   [place]  [facility]   1 year ago Essential hypertension   [place] [facility]       Future Appointments             In 4 days Duanne Limerick, MD [facility], [facility]             amLODipine (NORVASC) 10 MG tablet [Pharmacy Med Name: AMLODIPINE BESYLATE 10MG  TABLETS] 90 tablet 0    Sig: TAKE 1 TABLET(10 MG) BY MOUTH DAILY     Cardiovascular: Calcium Channel Blockers 2 Passed - 09/12/2022 11:21 AM      Passed - Last BP in normal range    BP Readings from Last 1 Encounters:  03/27/22 134/86         Passed - Last Heart Rate in  normal range    Pulse Readings from Last 1 Encounters:  03/27/22 84         Passed - Valid encounter within last 6 months    Recent Outpatient Visits           5 months ago Iron deficiency anemia due to chronic blood loss   [place] [facility]   5 months ago Essential hypertension   [place] [facility]   1 year ago Essential hypertension   [place] [facility], [facility]   1 year ago DOE (dyspnea on exertion)   [place] [facility]   1 year ago Essential hypertension   [place] [facility], [facility]       Future Appointments             In 4 days Duanne Limerick, MD [facility], [facility]             omeprazole (PRILOSEC) 40 MG capsule [Pharmacy Med Name: OMEPRAZOLE 40MG  CAPSULES] 90 capsule 0    Sig: TAKE 1 CAPSULE(40 MG) BY MOUTH EVERY MORNING     Gastroenterology: Proton Pump Inhibitors Passed - 09/12/2022 11:21 AM      Passed - Valid encounter  within last 12 months    Recent Outpatient Visits           5 months ago Iron deficiency anemia due to chronic blood loss   [facility]   5 months ago Essential hypertension   [place] [facility]   1 year ago Essential hypertension   [place] [facility]   1 year ago DOE (dyspnea on exertion)   [place] [facility] MD   1 year ago Essential hypertension   [place] [facility], [facility]       Future Appointments             In 4 days Duanne Limerick, MD [facility] at [facility]

## 2022-09-16 ENCOUNTER — Encounter: Payer: Self-pay | Admitting: Family Medicine

## 2022-09-16 ENCOUNTER — Ambulatory Visit (INDEPENDENT_AMBULATORY_CARE_PROVIDER_SITE_OTHER): Payer: Medicare PPO | Admitting: Family Medicine

## 2022-09-16 ENCOUNTER — Other Ambulatory Visit
Admission: RE | Admit: 2022-09-16 | Discharge: 2022-09-16 | Disposition: A | Discharge status: Home / Self Care | Payer: Medicare PPO | Attending: Family Medicine | Admitting: Family Medicine

## 2022-09-16 VITALS — BP 128/78 | HR 77 | Ht 70.0 in | Wt 184.0 lb

## 2022-09-16 DIAGNOSIS — K222 Esophageal obstruction: Secondary | ICD-10-CM

## 2022-09-16 DIAGNOSIS — D500 Iron deficiency anemia secondary to blood loss (chronic): Secondary | ICD-10-CM | POA: Insufficient documentation

## 2022-09-16 DIAGNOSIS — R634 Abnormal weight loss: Secondary | ICD-10-CM | POA: Diagnosis not present

## 2022-09-16 DIAGNOSIS — R0609 Other forms of dyspnea: Secondary | ICD-10-CM

## 2022-09-16 DIAGNOSIS — K760 Fatty (change of) liver, not elsewhere classified: Secondary | ICD-10-CM | POA: Diagnosis not present

## 2022-09-16 DIAGNOSIS — I10 Essential (primary) hypertension: Secondary | ICD-10-CM

## 2022-09-16 DIAGNOSIS — F32A Depression, unspecified: Secondary | ICD-10-CM

## 2022-09-16 DIAGNOSIS — E7801 Familial hypercholesterolemia: Secondary | ICD-10-CM

## 2022-09-16 DIAGNOSIS — F419 Anxiety disorder, unspecified: Secondary | ICD-10-CM | POA: Diagnosis not present

## 2022-09-16 DIAGNOSIS — K219 Gastro-esophageal reflux disease without esophagitis: Secondary | ICD-10-CM | POA: Diagnosis not present

## 2022-09-16 DIAGNOSIS — J984 Other disorders of lung: Secondary | ICD-10-CM

## 2022-09-16 LAB — RENAL FUNCTION PANEL
Albumin: 4.3 g/dL (ref 3.5–5.0)
Anion gap: 11 (ref 5–15)
BUN: 11 mg/dL (ref 6–20)
CO2: 23 mmol/L (ref 22–32)
Calcium: 8.8 mg/dL — ABNORMAL LOW (ref 8.9–10.3)
Chloride: 104 mmol/L (ref 98–111)
Creatinine, Ser: 0.88 mg/dL (ref 0.61–1.24)
GFR, Estimated: >60 mL/min (ref >60)
Glucose, Bld: 94 mg/dL (ref 70–99)
Phosphorus: 3.1 mg/dL (ref 2.5–4.6)
Potassium: 3.7 mmol/L (ref 3.5–5.1)
Sodium: 138 mmol/L (ref 135–145)

## 2022-09-16 LAB — LIPID PANEL
Cholesterol: 197 mg/dL (ref 0–200)
HDL: 109 mg/dL (ref >40)
LDL Cholesterol: 68 mg/dL (ref 0–99)
Total CHOL/HDL Ratio: 1.8 RATIO
Triglycerides: 101 mg/dL (ref <150)
VLDL: 20 mg/dL (ref 0–40)

## 2022-09-16 LAB — CBC WITH DIFFERENTIAL/PLATELET
Abs Immature Granulocytes: 0.01 10*3/uL (ref 0.00–0.07)
Basophils Absolute: 0.1 10*3/uL (ref 0.0–0.1)
Basophils Relative: 1 %
Eosinophils Absolute: 0.2 10*3/uL (ref 0.0–0.5)
Eosinophils Relative: 5 %
HCT: 44.2 % (ref 39.0–52.0)
Hemoglobin: 14.4 g/dL (ref 13.0–17.0)
Immature Granulocytes: 0 %
Lymphocytes Relative: 28 %
Lymphs Abs: 1 10*3/uL (ref 0.7–4.0)
MCH: 28.6 pg (ref 26.0–34.0)
MCHC: 32.6 g/dL (ref 30.0–36.0)
MCV: 87.9 fL (ref 80.0–100.0)
Monocytes Absolute: 0.4 10*3/uL (ref 0.1–1.0)
Monocytes Relative: 11 %
Neutro Abs: 2 10*3/uL (ref 1.7–7.7)
Neutrophils Relative %: 55 %
Platelets: 125 10*3/uL — ABNORMAL LOW (ref 150–400)
RBC: 5.03 MIL/uL (ref 4.22–5.81)
RDW: 18 % — ABNORMAL HIGH (ref 11.5–15.5)
WBC: 3.6 10*3/uL — ABNORMAL LOW (ref 4.0–10.5)
nRBC: 0 % (ref 0.0–0.2)

## 2022-09-16 MED ORDER — ALBUTEROL SULFATE HFA 108 (90 BASE) MCG/ACT IN AERS: 2.0000 | INHALATION_SPRAY | Freq: Four times a day (QID) | RESPIRATORY_TRACT | 1 refills | Status: DC | PRN

## 2022-09-16 MED ORDER — SERTRALINE HCL 100 MG PO TABS: ORAL_TABLET | ORAL | 1 refills | Status: DC

## 2022-09-16 MED ORDER — ATORVASTATIN CALCIUM 20 MG PO TABS: 20.0000 mg | ORAL_TABLET | Freq: Every day | ORAL | 1 refills | Status: DC

## 2022-09-16 MED ORDER — HYDRALAZINE HCL 10 MG PO TABS: ORAL_TABLET | ORAL | 1 refills | Status: DC

## 2022-09-16 MED ORDER — OMEPRAZOLE 40 MG PO CPDR: DELAYED_RELEASE_CAPSULE | ORAL | 1 refills | Status: DC

## 2022-09-16 MED ORDER — AMLODIPINE BESYLATE 10 MG PO TABS: ORAL_TABLET | ORAL | 1 refills | Status: DC

## 2022-09-16 MED ORDER — LOSARTAN POTASSIUM 100 MG PO TABS: ORAL_TABLET | ORAL | 1 refills | Status: DC

## 2022-09-16 NOTE — Progress Notes (Signed)
Date:  09/16/2022   Name:  Chad Hudson [facility]   DOB:  12-15-1960   MRN:  [id number]   Chief Complaint: Depression, Gastroesophageal Reflux, Hypertension, Hyperlipidemia, and COPD  Depression        This is a chronic problem.  The current episode started more than 1 year ago.   The problem occurs every several days.  The problem has been gradually worsening since onset.  Associated symptoms include no decreased concentration, no fatigue, no helplessness, no hopelessness, does not have insomnia, not irritable, no restlessness, no decreased interest, no appetite change, no body aches, no myalgias, no headaches, no indigestion, not sad and no suicidal ideas.  Past treatments include SSRIs - Selective serotonin reuptake inhibitors.  Compliance with treatment is variable.   Pertinent negatives include no hypothyroidism. Gastroesophageal Reflux He complains of dysphagia. He reports no chest pain, no coughing, no nausea, no sore throat or no wheezing. This is a chronic problem. The problem has been gradually improving. Pertinent negatives include no anemia, fatigue, melena, muscle weakness, orthopnea or weight loss. He has tried a PPI for the symptoms. The treatment provided moderate relief.  Hypertension This is a chronic problem. The problem has been gradually improving since onset. The problem is controlled. Pertinent negatives include no blurred vision, chest pain, headaches, palpitations or shortness of breath. There are no associated agents to hypertension. Risk factors for coronary artery disease include dyslipidemia and smoking/tobacco exposure. Past treatments include angiotensin blockers, direct vasodilators and calcium channel blockers. The current treatment provides moderate improvement. There is no history of angina, CAD/MI or CVA. There is no history of chronic renal disease.  Hyperlipidemia This is a chronic problem. The current episode started more than 1 year ago. The problem is  controlled. He has no history of chronic renal disease, diabetes, hypothyroidism, liver disease, obesity or nephrotic syndrome. There are no known factors aggravating his hyperlipidemia. Pertinent negatives include no chest pain, focal sensory loss, focal weakness, leg pain, myalgias or shortness of breath. Current antihyperlipidemic treatment includes statins. The current treatment provides moderate improvement of lipids. There are no compliance problems.   COPD There is no cough, shortness of breath or wheezing. This is a chronic problem. The problem has been gradually improving. Pertinent negatives include no appetite change, chest pain, headaches, myalgias, orthopnea, sore throat or weight loss. His symptoms are aggravated by nothing. He reports moderate improvement on treatment. There are no known risk factors for lung disease. His past medical history is significant for COPD.    Lab Results  Component Value Date   NA 139 03/20/2022   K 3.4 (L) 03/20/2022   CO2 25 03/20/2022   GLUCOSE 97 03/20/2022   BUN 13 03/20/2022   CREATININE 0.70 03/20/2022   CALCIUM 9.1 03/20/2022   EGFR 91 06/20/2020   GFRNONAA >60 03/20/2022   Lab Results  Component Value Date   CHOL 193 09/11/2021   HDL 121 09/11/2021   LDLCALC 58 09/11/2021   TRIG 71 09/11/2021   CHOLHDL 1.6 09/11/2021   No results found for: "TSH" No results found for: "HGBA1C" Lab Results  Component Value Date   WBC 5.8 05/30/2022   HGB 12.7 (L) 05/30/2022   HCT 42.5 05/30/2022   MCV 78.8 (L) 05/30/2022   PLT 150 05/30/2022   Lab Results  Component Value Date   ALT 37 03/20/2022   AST 39 03/20/2022   ALKPHOS 60 03/20/2022   BILITOT 1.1 03/20/2022   No results found for: "25OHVITD2", "25OHVITD3", "  VD25OH"   Review of Systems  Constitutional:  Positive for unexpected weight change. Negative for appetite change, fatigue and weight loss.  HENT:  Negative for sore throat.   Eyes:  Negative for blurred vision.  Respiratory:   Negative for cough, chest tightness, shortness of breath and wheezing.   Cardiovascular:  Negative for chest pain, palpitations and leg swelling.  Gastrointestinal:  Positive for dysphagia. Negative for melena and nausea.  Musculoskeletal:  Negative for myalgias and muscle weakness.  Neurological:  Negative for focal weakness and headaches.  Psychiatric/Behavioral:  Positive for depression. Negative for decreased concentration and suicidal ideas. The patient does not have insomnia.     Patient Active Problem List   Diagnosis Date Noted   Pharyngoesophageal dysphagia 03/29/2021   SOBOE (shortness of breath on exertion) 10/09/2020   Iron deficiency anemia due to chronic blood loss    Stricture and stenosis of esophagus    Class 1 obesity due to excess calories without serious comorbidity with body mass index (BMI) of 30.0 to 30.9 in adult 05/12/2019   Anxiety and depression 05/12/2019   Gastroesophageal reflux disease 05/12/2019   Hyperlipidemia 05/12/2019   Leukopenia 05/12/2019   Vitamin D deficiency 05/12/2019   Essential hypertension 01/07/2018   History of cholesteatoma 06/10/2012   Mixed conductive and sensorineural hearing loss of left ear 06/10/2012    No Known Allergies  Past Surgical History:  Procedure Laterality Date   COLONOSCOPY WITH PROPOFOL N/A 08/16/2020   Procedure: COLONOSCOPY WITH PROPOFOL;  Surgeon: Midge Minium, MD;  Location: [facility];  Service: Endoscopy;  Laterality: N/A;   ESOPHAGEAL DILATION  08/16/2020   Procedure: ESOPHAGEAL DILATION;  Surgeon: Midge Minium, MD;  Location: [facility];  Service: Endoscopy;;   ESOPHAGOGASTRODUODENOSCOPY (EGD) WITH PROPOFOL N/A 08/16/2020   Procedure: ESOPHAGOGASTRODUODENOSCOPY (EGD) WITH PROPOFOL;  Surgeon: Midge Minium, MD;  Location: [facility];  Service: Endoscopy;  Laterality: N/A;   GIVENS CAPSULE STUDY N/A 09/18/2020   Procedure: GIVENS CAPSULE STUDY;  Surgeon: Midge Minium, MD;  Location: [facility]  ENDOSCOPY;  Service: Endoscopy;  Laterality: N/A;   HEMORRHOID SURGERY     INNER EAR SURGERY     LUMBAR FUSION  2011    Social History   Tobacco Use   Smoking status: Former    Packs/day: 0.50    Years: 20.00    Additional pack years: 0.00    Total pack years: 10.00    Types: Cigarettes    Quit date: 04/14/2020    Years since quitting: 2.4   Smokeless tobacco: Never  Vaping Use   Vaping Use: Never used  Substance Use Topics   Alcohol use: Yes    Alcohol/week: 6.0 - 10.0 standard drinks of alcohol    Types: 6 - 10 Standard drinks or equivalent per week    Comment: 3-7 airplane bottles vodka approx 3 days per week 12/26/20   Drug use: Not Currently     Medication list has been reviewed and updated.  Current Meds  Medication Sig   albuterol (VENTOLIN HFA) 108 (90 Base) MCG/ACT inhaler INHALE 2 PUFFS INTO THE LUNGS EVERY 6 HOURS AS NEEDED FOR WHEEZING OR SHORTNESS OF BREATH   amLODipine (NORVASC) 10 MG tablet TAKE 1 TABLET(10 MG) BY MOUTH DAILY   atorvastatin (LIPITOR) 20 MG tablet Take 1 tablet by mouth daily.   ferrous sulfate 325 (65 FE) MG tablet Take 1 tablet (325 mg total) by mouth daily.   hydrALAZINE (APRESOLINE) 10 MG tablet TAKE 1 TABLET(10 MG) BY  MOUTH THREE TIMES DAILY   losartan (COZAAR) 100 MG tablet TAKE 1 TABLET(100 MG) BY MOUTH DAILY   omeprazole (PRILOSEC) 40 MG capsule TAKE 1 CAPSULE(40 MG) BY MOUTH EVERY MORNING   ondansetron (ZOFRAN) 4 MG tablet Take 1 tablet (4 mg total) by mouth every 8 (eight) hours as needed for nausea or vomiting.   sertraline (ZOLOFT) 100 MG tablet TAKE 2 TABLETS(200 MG) BY MOUTH DAILY       09/16/2022    9:21 AM 03/27/2022    1:47 PM 03/20/2022   10:51 AM 09/11/2021   10:22 AM  GAD 7 : Generalized Anxiety Score  Nervous, Anxious, on Edge 0 0 0 0  Control/stop worrying 0 0 0 0  Worry too much - different things 0 0 0 0  Trouble relaxing 0 0 0 0  Restless 0 0 0 0  Easily annoyed or irritable 0 0 0 0  Afraid - awful might happen  0 0 0 0  Total GAD 7 Score 0 0 0 0  Anxiety Difficulty Not difficult at all Not difficult at all Not difficult at all Not difficult at all       09/16/2022    9:20 AM 03/27/2022    1:47 PM 03/20/2022   10:51 AM  Depression screen PHQ 2/9  Decreased Interest 0 0 0  Down, Depressed, Hopeless 0 0 0  PHQ - 2 Score 0 0 0  Altered sleeping 0 0 0  Tired, decreased energy 0 0 0  Change in appetite 0 0 0  Feeling bad or failure about yourself  0 0 0  Trouble concentrating 0 0 0  Moving slowly or fidgety/restless 0 0 0  Suicidal thoughts 0 0 0  PHQ-9 Score 0 0 0  Difficult doing work/chores Not difficult at all Not difficult at all Not difficult at all    BP Readings from Last 3 Encounters:  09/16/22 128/78  03/27/22 134/86  03/21/22 (!) 160/104    Physical Exam Vitals and nursing note reviewed.  Constitutional:      General: He is not irritable. HENT:     Head: Normocephalic.     Right Ear: Tympanic membrane and external ear normal.     Left Ear: Tympanic membrane and external ear normal.     Nose: Nose normal. No congestion or rhinorrhea.     Mouth/Throat:     Mouth: Mucous membranes are moist.     Pharynx: No oropharyngeal exudate or posterior oropharyngeal erythema.  Eyes:     General: No scleral icterus.       Right eye: No discharge.        Left eye: No discharge.     Conjunctiva/sclera: Conjunctivae normal.     Pupils: Pupils are equal, round, and reactive to light.  Neck:     Thyroid: No thyromegaly.     Vascular: No carotid bruit or JVD.     Trachea: No tracheal deviation.  Cardiovascular:     Rate and Rhythm: Normal rate and regular rhythm.     Heart sounds: Normal heart sounds. No murmur heard.    No friction rub. No gallop.  Pulmonary:     Effort: No respiratory distress.     Breath sounds: Normal breath sounds. No wheezing, rhonchi or rales.  Abdominal:     General: Bowel sounds are normal.     Palpations: Abdomen is soft. There is no mass.      Tenderness: There is no abdominal tenderness. There is no guarding  or rebound.  Musculoskeletal:        General: No tenderness. Normal range of motion.     Cervical back: Normal range of motion and neck supple. No rigidity or tenderness.  Lymphadenopathy:     Cervical: No cervical adenopathy.  Skin:    General: Skin is warm.     Findings: No rash.  Neurological:     Mental Status: He is alert and oriented to person, place, and time.     Cranial Nerves: No cranial nerve deficit.     Deep Tendon Reflexes: Reflexes are normal and symmetric.     Wt Readings from Last 3 Encounters:  09/16/22 184 lb (83.5 kg)  03/27/22 193 lb (87.5 kg)  03/20/22 196 lb (88.9 kg)    BP 128/78   Pulse 77   Ht 5\' 10"  (1.778 m)   Wt 184 lb (83.5 kg)   SpO2 96%   BMI 26.40 kg/m   Assessment and Plan: 1. Essential hypertension Chronic.  Controlled.  Stable.  Blood pressure today 128/78.  Asymptomatic.  Tolerating medications well.  Continue amlodipine 10 mg once a day, hydralazine 10 mg 1 3 times a day, losartan 100 mg once a day.  Will check renal function panel for electrolytes and GFR. - amLODipine (NORVASC) 10 MG tablet; TAKE 1 TABLET(10 MG) BY MOUTH DAILY  Dispense: 90 tablet; Refill: 1 - hydrALAZINE (APRESOLINE) 10 MG tablet; TAKE 1 TABLET(10 MG) BY MOUTH THREE TIMES DAILY  Dispense: 270 tablet; Refill: 1 - losartan (COZAAR) 100 MG tablet; TAKE 1 TABLET(100 MG) BY MOUTH DAILY  Dispense: 90 tablet; Refill: 1 - Renal Function Panel  2. Weight loss, unintentional Patient has had another period of unexpected weight loss which she attributes that he has not eating as much however I have concerns because of other issues below.  3. Stricture and stenosis of esophagus With history of stricture and stenosis of esophagus with anemia patient had endoscopy in 2022.  4. Steatosis of liver Most recently it was noted that patient has likely steatosis and is currently on atorvastatin 20 mg once a day.  Will  check lipid panel for level of LDL control and refer to gastroenterology for follow-up and surveillance for hepatocellular disease. - atorvastatin (LIPITOR) 20 MG tablet; Take 1 tablet (20 mg total) by mouth daily. Take 1 tablet by mouth daily.  Dispense: 90 tablet; Refill: 1 - Lipid Panel With LDL/HDL Ratio  5. Familial hypercholesterolemia Chronic.  Controlled.  Stable.  Continue atorvastatin 20 mg daily. - atorvastatin (LIPITOR) 20 MG tablet; Take 1 tablet (20 mg total) by mouth daily. Take 1 tablet by mouth daily.  Dispense: 90 tablet; Refill: 1  6. Gastroesophageal reflux disease without esophagitis .  Controlled.  Stable.  Continue omeprazole 40 mg once a day. - omeprazole (PRILOSEC) 40 MG capsule; TAKE 1 CAPSULE(40 MG) BY MOUTH EVERY MORNING  Dispense: 90 capsule; Refill: 1  7. Iron deficiency anemia due to chronic blood loss Patient has iron deficiency anemia thought to be due to blood loss.  Patient has had colonoscopy and endoscopy and we will continue to monitor for iron deficiency anemia. - CBC with Differential/Platelet  8. Cavitary lesion of lung Last year patient had a cavitary lesion that was thought to be secondary to aspiration.  With continued weight loss and patient was to return to pulmonary for reevaluation in 3 months approximately a year ago there was no return.  We have reissued a referral for follow-up on this given the patient's had  continued weight loss.  9. Anxiety and depression Chronic.  Controlled.  Stable.  PHQ 0 GAD score 0 continue sertraline 100 mg daily. - sertraline (ZOLOFT) 100 MG tablet; TAKE 2 TABLETS(200 MG) BY MOUTH DAILY  Dispense: 90 tablet; Refill: 1  10. DOE (dyspnea on exertion) Chronic.  Controlled.  Stable.  Likely secondary to mild intermittent COPD secondary to smoking and patient will continue 2 puffs every 6 hours as needed for wheezing shortness of breath. - albuterol (VENTOLIN HFA) 108 (90 Base) MCG/ACT inhaler; Inhale 2 puffs into the  lungs every 6 (six) hours as needed for wheezing or shortness of breath.  Dispense: 8 g; Refill: 1     Elizabeth Sauer, MD

## 2022-09-19 ENCOUNTER — Ambulatory Visit: Payer: Medicare PPO | Admitting: Family Medicine

## 2022-10-01 NOTE — Progress Notes (Signed)
Celso Amy, PA-C [address]  [address]  [place], [place] [address]  Main: [phone]  Fax: [phone]   Primary Care Physician: Duanne Limerick, MD  Primary Gastroenterologist:  Dr. Midge Minium / Celso Amy, PA-C   CC: F/U Iron Deficiency Anemia and weight loss  HPI: Chad Hudson is a 62 y.o. male, who last saw Dr. Daleen Squibb in 2022 to evaluate iron deficiency anemia, returns.  Most recent lab work 09/16/2022 showed greatly improved normal hemoglobin 14.4, hematocrit 44, MCV 87.  Low platelets 125.  Labs 05/2022 showed hemoglobin 12.7.  Labs 03/2022 showed hemoglobin 8.7.  Currently on iron?  B12?  Recent LFTs?  EGD 08/2020 (to evaluate IDA) showed medium hiatal hernia, 1 benign appearing mild stenosis at the GE junction dilated to 18 mm.  Normal stomach and duodenum.  Colonoscopy 08/2020 showed excellent prep, grade 1 nonbleeding internal hemorrhoids, and sigmoid diverticulosis.  No polyps.  Capsule endoscopy 09/2020 was normal.  RUQ abdominal ultrasound 09/2021 (to evaluate elevated LFTs) showed hepatic steatosis, otherwise normal.  No liver masses.  No gallstones.  Chest CT without contrast 09/2021 showed 2 mm right upper lobe lung nodule, likely benign.  Linear bandlike scarring at the right lung apex with resolution of previous cavitary lesion.  Moderate to large hiatal hernia with mild wall thickening of the distal esophagus, consistent with reflux esophagitis.  Hepatic steatosis with splenomegaly.  Current Outpatient Medications  Medication Sig Dispense Refill   albuterol (VENTOLIN HFA) 108 (90 Base) MCG/ACT inhaler Inhale 2 puffs into the lungs every 6 (six) hours as needed for wheezing or shortness of breath. 8 g 1   amLODipine (NORVASC) 10 MG tablet TAKE 1 TABLET(10 MG) BY MOUTH DAILY 90 tablet 1   atorvastatin (LIPITOR) 20 MG tablet Take 1 tablet (20 mg total) by mouth daily. Take 1 tablet by mouth daily. 90 tablet 1   ferrous sulfate 325 (65 FE) MG tablet  Take 1 tablet (325 mg total) by mouth daily. 30 tablet 2   hydrALAZINE (APRESOLINE) 10 MG tablet TAKE 1 TABLET(10 MG) BY MOUTH THREE TIMES DAILY 270 tablet 1   losartan (COZAAR) 100 MG tablet TAKE 1 TABLET(100 MG) BY MOUTH DAILY 90 tablet 1   omeprazole (PRILOSEC) 40 MG capsule TAKE 1 CAPSULE(40 MG) BY MOUTH EVERY MORNING 90 capsule 1   ondansetron (ZOFRAN) 4 MG tablet Take 1 tablet (4 mg total) by mouth every 8 (eight) hours as needed for nausea or vomiting. 20 tablet 0   sertraline (ZOLOFT) 100 MG tablet TAKE 2 TABLETS(200 MG) BY MOUTH DAILY 90 tablet 1   No current facility-administered medications for this visit.    Allergies as of 10/02/2022   (No Known Allergies)    Past Medical History:  Diagnosis Date   Alcoholism (HCC)    Anemia    Anxiety    Depression    GERD (gastroesophageal reflux disease)    Hyperlipidemia    Hypertension     Past Surgical History:  Procedure Laterality Date   COLONOSCOPY WITH PROPOFOL N/A 08/16/2020   Procedure: COLONOSCOPY WITH PROPOFOL;  Surgeon: Midge Minium, MD;  Location: [facility];  Service: Endoscopy;  Laterality: N/A;   ESOPHAGEAL DILATION  08/16/2020   Procedure: ESOPHAGEAL DILATION;  Surgeon: Midge Minium, MD;  Location: [facility];  Service: Endoscopy;;   ESOPHAGOGASTRODUODENOSCOPY (EGD) WITH PROPOFOL N/A 08/16/2020   Procedure: ESOPHAGOGASTRODUODENOSCOPY (EGD) WITH PROPOFOL;  Surgeon: Midge Minium, MD;  Location: [facility];  Service: Endoscopy;  Laterality:  N/A;   GIVENS CAPSULE STUDY N/A 09/18/2020   Procedure: GIVENS CAPSULE STUDY;  Surgeon: Midge Minium, MD;  Location: [facility] ENDOSCOPY;  Service: Endoscopy;  Laterality: N/A;   HEMORRHOID SURGERY     INNER EAR SURGERY     LUMBAR FUSION  2011    Review of Systems:    All systems reviewed and negative except where noted in HPI.   Physical Examination:   There were no vitals taken for this visit.  General: Well-nourished, well-developed in no acute distress.   Eyes: No icterus. Conjunctivae pink. Mouth: Oropharyngeal mucosa moist and pink , no lesions erythema or exudate. Lungs: Clear to auscultation bilaterally. Non-labored. Heart: Regular rate and rhythm, no murmurs rubs or gallops.  Abdomen: Bowel sounds are normal; Abdomen is Soft; No hepatosplenomegaly, masses or hernias;  No Abdominal Tenderness; No guarding or rebound tenderness. Extremities: No lower extremity edema. No clubbing or deformities. Neuro: Alert and oriented x 3.  Grossly intact. Skin: Warm and dry, no jaundice.   Psych: Alert and cooperative, normal mood and affect.   Imaging Studies: No results found.  Assessment and Plan:   Chad Hudson is a 62 y.o. y/o male   1.  Iron deficiency anemia  2.  Weight loss  Abdominal pelvic CT with contrast  3.  GERD with esophagitis  4.  Large hiatal hernia  5.  Hepatic steatosis    Celso Amy, PA-C  Follow up in ***  BP check ***

## 2022-10-02 ENCOUNTER — Encounter: Payer: Self-pay | Admitting: Physician Assistant

## 2022-10-02 ENCOUNTER — Ambulatory Visit: Payer: Medicare PPO | Admitting: Physician Assistant

## 2022-10-02 VITALS — BP 183/104 | HR 69 | Temp 98.3°F | Ht 70.0 in | Wt 185.8 lb

## 2022-10-02 DIAGNOSIS — K219 Gastro-esophageal reflux disease without esophagitis: Secondary | ICD-10-CM | POA: Diagnosis not present

## 2022-10-02 DIAGNOSIS — R7989 Other specified abnormal findings of blood chemistry: Secondary | ICD-10-CM

## 2022-10-02 DIAGNOSIS — R634 Abnormal weight loss: Secondary | ICD-10-CM | POA: Diagnosis not present

## 2022-10-02 DIAGNOSIS — K449 Diaphragmatic hernia without obstruction or gangrene: Secondary | ICD-10-CM | POA: Diagnosis not present

## 2022-10-02 DIAGNOSIS — D649 Anemia, unspecified: Secondary | ICD-10-CM | POA: Diagnosis not present

## 2022-10-02 DIAGNOSIS — K760 Fatty (change of) liver, not elsewhere classified: Secondary | ICD-10-CM | POA: Diagnosis not present

## 2022-10-02 NOTE — Patient Instructions (Addendum)
CT Abdomen/Pelvis scheduled 10/09/22 @ 2:45 @ [facility] entrance.

## 2022-10-03 LAB — COMPREHENSIVE METABOLIC PANEL
ALT: 32 IU/L (ref 0–44)
AST: 34 IU/L (ref 0–40)
Albumin: 4.7 g/dL (ref 3.8–4.9)
Alkaline Phosphatase: 85 IU/L (ref 44–121)
BUN/Creatinine Ratio: 12 (ref 9–20)
BUN: 11 mg/dL (ref 6–24)
Bilirubin Total: 0.3 mg/dL (ref 0.0–1.2)
CO2: 25 mmol/L (ref 20–29)
Calcium: 9.7 mg/dL (ref 8.7–10.2)
Chloride: 102 mmol/L (ref 96–106)
Creatinine, Ser: 0.89 mg/dL (ref 0.76–1.27)
Globulin, Total: 2.8 g/dL (ref 1.5–4.5)
Glucose: 120 mg/dL — ABNORMAL HIGH (ref 70–99)
Potassium: 4 mmol/L (ref 3.5–5.2)
Sodium: 141 mmol/L (ref 134–144)
Total Protein: 7.5 g/dL (ref 6.0–8.5)
eGFR: 99 mL/min/{1.73_m2} (ref >59)

## 2022-10-03 LAB — CBC
Hematocrit: 46.3 % (ref 37.5–51.0)
Hemoglobin: 14.8 g/dL (ref 13.0–17.7)
MCH: 28 pg (ref 26.6–33.0)
MCHC: 32 g/dL (ref 31.5–35.7)
MCV: 88 fL (ref 79–97)
Platelets: 151 10*3/uL (ref 150–450)
RBC: 5.28 x10E6/uL (ref 4.14–5.80)
RDW: 15.5 % — ABNORMAL HIGH (ref 11.6–15.4)
WBC: 5.8 10*3/uL (ref 3.4–10.8)

## 2022-10-03 LAB — B12 AND FOLATE PANEL
Folate: 4.9 ng/mL (ref >3.0)
Vitamin B-12: 395 pg/mL (ref 232–1245)

## 2022-10-03 LAB — TSH: TSH: 1.41 u[IU]/mL (ref 0.450–4.500)

## 2022-10-03 LAB — IRON,TIBC AND FERRITIN PANEL
Ferritin: 38 ng/mL (ref 30–400)
Iron Saturation: 9 % — CL (ref 15–55)
Iron: 44 ug/dL (ref 38–169)
Total Iron Binding Capacity: 467 ug/dL — ABNORMAL HIGH (ref 250–450)
UIBC: 423 ug/dL — ABNORMAL HIGH (ref 111–343)

## 2022-10-03 NOTE — Progress Notes (Signed)
Notify pt.: Hemoglobin is normal 14.8 G.  Kidney and liver tests are normal.  Glucose 120, slightly elevated, okay not fasting.  Platelets have improved to 151, within normal range.  Vitamin B12, folate, and thyroid test are normal.  Iron saturation is low.  Total iron has improved.  I recommend take OTC Slow Fe Iron 1 tablet 3 days/week before bedtime.  Nothing worrisome.  Continue with current plan.

## 2022-10-06 ENCOUNTER — Telehealth: Payer: Self-pay

## 2022-10-06 NOTE — Telephone Encounter (Signed)
Tried reaching patient-mailbox is full-unable to leave message.   Hemoglobin is normal 14.8 G.  Kidney and liver tests are normal.  Glucose 120, slightly elevated, okay not fasting.  Platelets have improved to 151, within normal range.  Vitamin B12, folate, and thyroid test are normal.  Iron saturation is low.  Total iron has improved.  I recommend take OTC Slow Fe Iron 1 tablet 3 days/week before bedtime.  Nothing worrisome.  Continue with current plan.

## 2022-10-07 ENCOUNTER — Telehealth: Payer: Self-pay

## 2022-10-07 NOTE — Telephone Encounter (Signed)
Tried reaching patient- mailbox full-unable to leave message.   Hemoglobin is normal 14.8 G.  Kidney and liver tests are normal.  Glucose 120, slightly elevated, okay not fasting.  Platelets have improved to 151, within normal range.  Vitamin B12, folate, and thyroid test are normal.  Iron saturation is low.  Total iron has improved.  I recommend take OTC Slow Fe Iron 1 tablet 3 days/week before bedtime.  Nothing worrisome.  Continue with current plan.

## 2022-10-09 ENCOUNTER — Ambulatory Visit
Admission: RE | Admit: 2022-10-09 | Discharge: 2022-10-09 | Disposition: A | Discharge status: Home / Self Care | Payer: Medicare PPO | Source: Ambulatory Visit | Attending: Physician Assistant | Admitting: Physician Assistant

## 2022-10-09 DIAGNOSIS — R634 Abnormal weight loss: Secondary | ICD-10-CM | POA: Insufficient documentation

## 2022-10-09 DIAGNOSIS — K5730 Diverticulosis of large intestine without perforation or abscess without bleeding: Secondary | ICD-10-CM | POA: Diagnosis not present

## 2022-10-09 DIAGNOSIS — N281 Cyst of kidney, acquired: Secondary | ICD-10-CM | POA: Diagnosis not present

## 2022-10-09 DIAGNOSIS — K449 Diaphragmatic hernia without obstruction or gangrene: Secondary | ICD-10-CM | POA: Diagnosis not present

## 2022-10-09 MED ORDER — IOHEXOL 300 MG/ML  SOLN
100.0000 mL | Freq: Once | INTRAMUSCULAR | Status: AC | PRN
  Administered 2022-10-09: 100 mL via INTRAVENOUS

## 2022-10-13 ENCOUNTER — Telehealth: Payer: Self-pay

## 2022-10-13 NOTE — Telephone Encounter (Signed)
Mailed letter asking patient to call office to discuss lab results.

## 2022-10-17 ENCOUNTER — Other Ambulatory Visit: Payer: Self-pay | Admitting: Family Medicine

## 2022-10-17 DIAGNOSIS — I10 Essential (primary) hypertension: Secondary | ICD-10-CM

## 2022-10-17 DIAGNOSIS — F419 Anxiety disorder, unspecified: Secondary | ICD-10-CM

## 2022-10-17 NOTE — Telephone Encounter (Signed)
Rxs were sent to same pharmacy 09/16/22 #90/1 RF.   Requested Prescriptions  Pending Prescriptions Disp Refills   losartan (COZAAR) 100 MG tablet [Pharmacy Med Name: LOSARTAN 100MG  TABLETS] 90 tablet 1    Sig: TAKE 1 TABLET(100 MG) BY MOUTH DAILY     Cardiovascular:  Angiotensin Receptor Blockers Failed - 10/17/2022  4:23 PM      Failed - Last BP in normal range    BP Readings from Last 1 Encounters:  10/02/22 (!) 183/104         Passed - Cr in normal range and within 180 days    Creatinine, Ser  Date Value Ref Range Status  10/02/2022 0.89 0.76 - 1.27 mg/dL Final         Passed - K in normal range and within 180 days    Potassium  Date Value Ref Range Status  10/02/2022 4.0 3.5 - 5.2 mmol/L Final         Passed - Patient is not pregnant      Passed - Valid encounter within last 6 months    Recent Outpatient Visits           1 month ago Essential hypertension   [place] [facility], [facility]   6 months ago Iron deficiency anemia due to chronic blood loss   [facility], [facility]   7 months ago Essential hypertension   [place] [facility], MD   1 year ago Essential hypertension   [place] [facility], MD   1 year ago DOE (dyspnea on exertion)   [place] [facility], MD       Future Appointments             In 5 months Duanne Limerick, MD [facility] at [facility]             sertraline (ZOLOFT) 100 MG tablet [Pharmacy Med Name: SERTRALINE 100MG  TABLETS] 60 tablet     Sig: TAKE 2 TABLETS(200 MG) BY MOUTH DAILY     Psychiatry:  Antidepressants - SSRI - sertraline Passed - 10/17/2022  4:23 PM      Passed - AST in normal range and within 360  days    AST  Date Value Ref Range Status  10/02/2022 34 0 - 40 IU/L Final         Passed - ALT in normal range and within 360 days    ALT  Date Value Ref Range Status  10/02/2022 32 0 - 44 IU/L Final         Passed - Completed PHQ-2 or PHQ-9 in the last 360 days      Passed - Valid encounter within last 6 months    Recent Outpatient Visits           1 month ago Essential hypertension   [place] [facility], MD   6 months ago Iron deficiency anemia due to chronic blood loss   [facility], MD   7 months ago Essential hypertension   [place] [facility], MD   1 year ago Essential hypertension   [place] [facility], MD   1 year ago DOE (dyspnea on exertion)   [facility] at [facility], MD       Future Appointments             In 5 months Duanne Limerick, MD [facility] at [facility]

## 2022-10-29 NOTE — Progress Notes (Signed)
Celso Amy, PA-C [address]  [address]  [place], [place] [address]  Main: [phone]  Fax: [phone]   Primary Care Physician: Duanne Limerick, MD  Primary Gastroenterologist:  Celso Amy, PA-C / Dr. Midge Minium    CC: Follow-up dysphagia, weight loss, iron deficiency anemia  HPI: Chad Hudson is a 62 y.o. male returns for follow-up of weight loss and anemia.  Current GI symptoms: He is having episodes of solid food dysphagia.  Solid food getting stuck in his mid chest not going down well.  Food occasionally comes back up.  Currently taking omeprazole 40 mg once daily with good control of acid reflux.  Weight is stable.  He has chronic loose stools.  Denies constipation or rectal bleeding.    Labs 10/02/2022 showed normal hemoglobin 14.8, total iron 44, low iron saturation 9%.  Ferritin 38.  Normal CMP and TSH. Labs 05/2022 showed mild iron deficiency anemia with hemoglobin 12.7, MCV 78, ferritin 16.  He was started on oral iron.  Abdominal pelvic CT with contrast 10/16/2022 showed large hiatal hernia, hepatic steatosis, otherwise unrevealing.  Mild diverticulosis.  No explanation for weight loss.  EGD 08/2020 (to evaluate IDA) showed medium hiatal hernia, 1 benign appearing mild stenosis at the GE junction dilated to 18 mm.  Normal stomach and duodenum.  Duodenal biopsies showed mild nonspecific chronic duodenitis with villous blunting.  Negative for intraepithelial lymphocytes.  There was concern for possible Celiac.  Lab 08/2020 (celiac panel) showed slightly elevated tissue transglutaminase AB IgA of 6.  Endometrial antibody negative.   Colonoscopy 08/2020 showed excellent prep, grade 1 nonbleeding internal hemorrhoids, and sigmoid diverticulosis.  No polyps.  He has family history of maternal grandmother and maternal grandfather who had colon cancer.  5-year repeat colonoscopy.   Capsule endoscopy 09/2020 was normal.   RUQ abdominal ultrasound 09/2021 (to  evaluate elevated LFTs) showed hepatic steatosis, otherwise normal.  No liver masses.  No gallstones.   Chest CT without contrast 09/2021 showed 2 mm right upper lobe lung nodule, likely benign.  Linear bandlike scarring at the right lung apex with resolution of previous cavitary lesion.  Moderate to large hiatal hernia with mild wall thickening of the distal esophagus, consistent with reflux esophagitis.  Hepatic steatosis with splenomegaly.  Current Outpatient Medications  Medication Sig Dispense Refill   albuterol (VENTOLIN HFA) 108 (90 Base) MCG/ACT inhaler Inhale 2 puffs into the lungs every 6 (six) hours as needed for wheezing or shortness of breath. 8 g 1   amLODipine (NORVASC) 10 MG tablet TAKE 1 TABLET(10 MG) BY MOUTH DAILY 90 tablet 1   atorvastatin (LIPITOR) 20 MG tablet Take 1 tablet (20 mg total) by mouth daily. Take 1 tablet by mouth daily. 90 tablet 1   hydrALAZINE (APRESOLINE) 10 MG tablet TAKE 1 TABLET(10 MG) BY MOUTH THREE TIMES DAILY 270 tablet 1   losartan (COZAAR) 100 MG tablet TAKE 1 TABLET(100 MG) BY MOUTH DAILY 90 tablet 1   omeprazole (PRILOSEC) 40 MG capsule TAKE 1 CAPSULE(40 MG) BY MOUTH EVERY MORNING 90 capsule 1   sertraline (ZOLOFT) 100 MG tablet TAKE 2 TABLETS(200 MG) BY MOUTH DAILY 90 tablet 1   ferrous sulfate 325 (65 FE) MG tablet Take 1 tablet (325 mg total) by mouth daily. 30 tablet 2   No current facility-administered medications for this visit.    Allergies as of 10/30/2022   (No Known Allergies)    Past Medical History:  Diagnosis Date   Alcoholism (HCC)  Anemia    Anxiety    Depression    GERD (gastroesophageal reflux disease)    Hyperlipidemia    Hypertension     Past Surgical History:  Procedure Laterality Date   COLONOSCOPY WITH PROPOFOL N/A 08/16/2020   Procedure: COLONOSCOPY WITH PROPOFOL;  Surgeon: Midge Minium, MD;  Location: [facility];  Service: Endoscopy;  Laterality: N/A;   ESOPHAGEAL DILATION  08/16/2020   Procedure:  ESOPHAGEAL DILATION;  Surgeon: Midge Minium, MD;  Location: [facility];  Service: Endoscopy;;   ESOPHAGOGASTRODUODENOSCOPY (EGD) WITH PROPOFOL N/A 08/16/2020   Procedure: ESOPHAGOGASTRODUODENOSCOPY (EGD) WITH PROPOFOL;  Surgeon: Midge Minium, MD;  Location: [facility];  Service: Endoscopy;  Laterality: N/A;   GIVENS CAPSULE STUDY N/A 09/18/2020   Procedure: GIVENS CAPSULE STUDY;  Surgeon: Midge Minium, MD;  Location: [facility];  Service: Endoscopy;  Laterality: N/A;   HEMORRHOID SURGERY     INNER EAR SURGERY     LUMBAR FUSION  2011    Review of Systems:    All systems reviewed and negative except where noted in HPI.   Physical Examination:   BP (!) 161/100   Pulse 79   Temp 98.4 F (36.9 C)   Ht 5\' 10"  (1.778 m)   Wt 188 lb 12.8 oz (85.6 kg)   BMI 27.09 kg/m   General: Well-nourished, well-developed in no acute distress.  Eyes: No icterus. Conjunctivae pink. Mouth: Oropharyngeal mucosa moist and pink , no lesions erythema or exudate. Lungs: Clear to auscultation bilaterally. Non-labored. Heart: Regular rate and rhythm, no murmurs rubs or gallops.  Abdomen: Bowel sounds are normal; Abdomen is Soft; No hepatosplenomegaly, masses or hernias;  No Abdominal Tenderness; No guarding or rebound tenderness. Extremities: No lower extremity edema. No clubbing or deformities. Neuro: Alert and oriented x 3.  Grossly intact. Skin: Warm and dry, no jaundice.   Psych: Alert and cooperative, normal mood and affect.   Imaging Studies: CT Abdomen Pelvis W Contrast  Result Date: 10/16/2022 CLINICAL DATA:  Unintended weight loss for 20 pounds over several months. EXAM: CT ABDOMEN AND PELVIS WITH CONTRAST TECHNIQUE: Multidetector CT imaging of the abdomen and pelvis was performed using the standard protocol following bolus administration of intravenous contrast. RADIATION DOSE REDUCTION: This exam was performed according to the departmental dose-optimization program which includes  automated exposure control, adjustment of the mA and/or kV according to patient size and/or use of iterative reconstruction technique. CONTRAST:  OMNIPAQUE IOHEXOL 300 MG/ML  SOLN COMPARISON:  Ultrasound 09/17/2021 FINDINGS: Lower chest: Lung bases are clear. No pleural effusion. Large hiatal hernia. Hepatobiliary: Diffuse fatty liver infiltration identified. No space-occupying liver lesion. Patent portal vein. Gallbladder is nondilated. Pancreas: Unremarkable. No pancreatic ductal dilatation or surrounding inflammatory changes. Spleen: Normal in size without focal abnormality.  Small splenules. Adrenals/Urinary Tract: Adrenal glands are preserved. No enhancing renal mass or collecting system dilatation. Right kidney has a 14 mm upper pole Bosniak 1 renal cysts. Lower pole focus also is a benign Bosniak 1 cyst measuring 12 mm. Hounsfield unit of 9. The ureters have normal course and caliber extending down to the bladder. Preserved contours of the urinary bladder. Stomach/Bowel: No oral contrast. The stomach and small bowel are nondilated. Large bowel is of normal course and caliber. There is some left-sided colonic diverticula. Normal appendix. Vascular/Lymphatic: Scattered vascular calcifications. Normal caliber aorta and IVC. No specific abnormal lymph node enlargement identified in the abdomen and pelvis. Reproductive: Prostate is unremarkable. Other: No free air or free fluid. Musculoskeletal: Mild degenerative  changes seen along the spine and pelvis. There is fixation hardware along the lower lumbar spine from L4 through S1 with associated streak artifact from the hardware. Old left-sided rib fractures. IMPRESSION: Fatty liver infiltration. Large hiatal hernia. Few colonic diverticula. Electronically Signed   By: Karen Kays M.D.   On: 10/16/2022 12:28    Assessment and Plan:   Chad Hudson is a 62 y.o. y/o male returns for follow-up of iron deficiency anemia, weight loss, and dysphagia.   Recent abdominal pelvic CT showed no acute abnormality to explain weight loss.  Labs and EGD done 08/2020 showed esophageal stricture dilated and borderline results for possible celiac.  Patient is eating gluten.  He has loose stools and recurrent esophageal dysphagia.  I am ordering repeat EGD with possible dilation.  Also ordering repeat celiac labs.  1.  Celiac disease  Lab: Celiac panel, magnesium, vitamin D  Recent B12, folate, and iron were normal.  Recommend gluten-free diet  Refer to nutritionist to help with gluten-free diet  2.  Recurrent esophageal dysphagia with history of esophageal stricture  Scheduling EGD with Dilation I discussed risks of EGD with patient to include risk of bleeding, perforation, and risk of sedation.  Patient expressed understanding and agrees to proceed with EGD.   3.  Iron deficiency anemia; currently improved off iron.  Recent hemoglobin 14.8 g.    He cannot tolerate oral iron which causes GI upset.  4.  Fatty Liver; Hepatic Steatosis  Recent LFTs were normal.  Continue low-fat diet and regular exercise.  Avoid alcohol.  5.  Large hiatal hernia / acid reflux; GERD is controlled on omeprazole 40 Mg daily.  Continue omeprazole 40 Mg daily.  Discussed surgical options as a last resort if conservative treatment fails.  6.  Family history of colon cancer: Maternal grandfather and maternal grandmother  Negative colonoscopy 08/2020.  5-year repeat colonoscopy will be due 08/2025.    Celso Amy, PA-C  Follow up in 4 months with TG.

## 2022-10-30 ENCOUNTER — Encounter: Payer: Self-pay | Admitting: Physician Assistant

## 2022-10-30 ENCOUNTER — Ambulatory Visit: Payer: Medicare PPO | Admitting: Physician Assistant

## 2022-10-30 VITALS — BP 155/107 | HR 79 | Temp 98.4°F | Ht 70.0 in | Wt 188.8 lb

## 2022-10-30 DIAGNOSIS — K900 Celiac disease: Secondary | ICD-10-CM

## 2022-10-30 DIAGNOSIS — D509 Iron deficiency anemia, unspecified: Secondary | ICD-10-CM | POA: Diagnosis not present

## 2022-10-30 DIAGNOSIS — K222 Esophageal obstruction: Secondary | ICD-10-CM | POA: Diagnosis not present

## 2022-10-30 DIAGNOSIS — R1310 Dysphagia, unspecified: Secondary | ICD-10-CM

## 2022-10-30 DIAGNOSIS — R634 Abnormal weight loss: Secondary | ICD-10-CM | POA: Diagnosis not present

## 2022-10-30 NOTE — Patient Instructions (Signed)
Referral placed to Nutrition. Someone form their office will call to schedule an appointment. Please go to [facility] pharmacy across from [employer] store to have your labs done today.

## 2022-10-31 LAB — CELIAC AB TTG DGP TIGA: Gliadin IgG: 5 units (ref 0–19)

## 2022-11-02 ENCOUNTER — Other Ambulatory Visit: Payer: Self-pay | Admitting: Physician Assistant

## 2022-11-02 DIAGNOSIS — R7989 Other specified abnormal findings of blood chemistry: Secondary | ICD-10-CM

## 2022-11-02 LAB — MAGNESIUM: Magnesium: 1.8 mg/dL (ref 1.6–2.3)

## 2022-11-02 LAB — CELIAC AB TTG DGP TIGA
Antigliadin Abs, IgA: 43 units — ABNORMAL HIGH (ref 0–19)
IgA/Immunoglobulin A, Serum: 209 mg/dL (ref 90–386)

## 2022-11-02 LAB — VITAMIN D 25 HYDROXY (VIT D DEFICIENCY, FRACTURES): Vit D, 25-Hydroxy: 15 ng/mL — ABNORMAL LOW (ref 30.0–100.0)

## 2022-11-02 MED ORDER — VITAMIN D (ERGOCALCIFEROL) 1.25 MG (50000 UNIT) PO CAPS
50000.0000 [IU] | ORAL_CAPSULE | ORAL | 2 refills | Status: AC
Start: 2022-11-02 — End: 2023-01-31

## 2022-11-02 NOTE — Progress Notes (Signed)
Notify patient: 1.  Celiac lab is positive.  He is allergic to gluten.  Please avoid all gluten.  Refer to nutritionist for celiac and to help with gluten-free diet. 2.  Vitamin D is very low.  Please start vitamin D prescription, 50,000 IU, take once per week for three months.  Repeat vitamin D level in 3 months.  I sent Rx for Vitamin D to [facility] in [place]. 3.  Magnesium is normal.

## 2022-11-03 ENCOUNTER — Telehealth: Payer: Self-pay

## 2022-11-03 NOTE — Telephone Encounter (Signed)
error 

## 2022-12-08 ENCOUNTER — Encounter: Payer: Self-pay | Admitting: Gastroenterology

## 2022-12-10 ENCOUNTER — Encounter: Payer: Self-pay | Admitting: Pulmonary Disease

## 2022-12-10 ENCOUNTER — Ambulatory Visit (INDEPENDENT_AMBULATORY_CARE_PROVIDER_SITE_OTHER): Payer: Medicare PPO | Admitting: Pulmonary Disease

## 2022-12-10 ENCOUNTER — Ambulatory Visit
Admission: RE | Admit: 2022-12-10 | Discharge: 2022-12-10 | Disposition: A | Discharge status: Home / Self Care | Payer: Medicare PPO | Source: Ambulatory Visit | Attending: Pulmonary Disease | Admitting: Pulmonary Disease

## 2022-12-10 ENCOUNTER — Encounter: Payer: Self-pay | Admitting: Gastroenterology

## 2022-12-10 ENCOUNTER — Encounter: Payer: Self-pay | Admitting: Anesthesiology

## 2022-12-10 VITALS — BP 128/82 | HR 69 | Temp 98.1°F | Ht 70.0 in | Wt 187.8 lb

## 2022-12-10 DIAGNOSIS — J984 Other disorders of lung: Secondary | ICD-10-CM

## 2022-12-10 DIAGNOSIS — R0602 Shortness of breath: Secondary | ICD-10-CM | POA: Diagnosis not present

## 2022-12-10 DIAGNOSIS — K449 Diaphragmatic hernia without obstruction or gangrene: Secondary | ICD-10-CM | POA: Diagnosis not present

## 2022-12-10 NOTE — Anesthesia Preprocedure Evaluation (Signed)
Anesthesia Evaluation    Airway        Dental   Pulmonary former smoker          Cardiovascular hypertension,      Neuro/Psych    GI/Hepatic   Endo/Other    Renal/GU      Musculoskeletal   Abdominal   Peds  Hematology   Anesthesia Other Findings   Reproductive/Obstetrics                              Anesthesia Physical Anesthesia Plan Anesthesia Quick Evaluation  

## 2022-12-10 NOTE — Patient Instructions (Signed)
We are going to get a chest x-ray and breathing tests.  The chest x-ray you can get done today.  We will schedule you for the breathing test.  We will see you in follow-up in 6 to 8 weeks time call sooner should any new problems arise.

## 2022-12-10 NOTE — Progress Notes (Signed)
Subjective:    Patient ID: Chad Hudson, male    DOB: 01-03-1961, 62 y.o.   MRN: [id number]  Patient Care Team: Duanne Limerick, MD as PCP - General (Family Medicine) Midge Minium, MD as Consulting Physician (Gastroenterology)  Chief Complaint  Patient presents with   Follow-up    DOE. No wheezing or cough.    HPI Chad Hudson is a 62 year old former smoker (10 PY) who presents for follow-up of an abnormal chest x-ray.  He was initially seen here on 03 June 2021, for the details of that visit please refer to that note.  The patient at that time was noted to have a masslike opacity/infiltrate with central cavitation in the right upper lobe with dimensions of 10.2 x 7.9 cm.  This was consistent with a lung abscess/necrotizing pneumonia versus carcinoma.  The patient however noted that he had had cough productive of purulent sputum at that time but that his symptoms were improving dramatically on antibiotics.  He was treated for a total of 10 days of antibiotics.  He then was lost to follow-up until 19 September 2021 and at that time underwent chest x-ray that showed that the cavitary changes on the right upper lobe almost completely resolved.  This was the last time the patient was seen in the clinic.  During that visit he also had a chest CT ordered which showed complete resolution of the cavitary masslike lesion and no other findings.  The patient however does have a very large hiatal hernia and had some esophageal thickening noted.  Instructed to follow-up however he never did so.  It appears that he has been rereferred by Dr. Elizabeth Sauer given the prior issues with cavitary lung lesion and issues with weight loss.  The patient himself is not sure why he is here today.  He does come accompanied by his sister who tells me that she has noted that over the last 2 months he has been getting short of breath on exertion.  On review of systems the patient states that he had had some weight loss but no  longer is having this and this has stabilized.  He has not had anorexia.  Has not had any cough or sputum production.  No hemoptysis.  No wheezing.  He continues to have issues with reflux but this is fairly controlled with PPI.  He does not endorse any reflux however he seems quite reserved during the visit today and not endorsing very many symptoms.  Previously he had issues with nocturnal reflux but he is not endorsing this at present.  He is not smoking cigarettes but does endorse smoking marijuana.   Review of Systems A 10 point review of systems was performed and it is as noted above otherwise negative.   Past Medical History:  Diagnosis Date   Alcoholism (HCC)    Anemia    Anxiety    Depression    GERD (gastroesophageal reflux disease)    Hyperlipidemia    Hypertension     Past Surgical History:  Procedure Laterality Date   COLONOSCOPY WITH PROPOFOL N/A 08/16/2020   Procedure: COLONOSCOPY WITH PROPOFOL;  Surgeon: Midge Minium, MD;  Location: [facility];  Service: Endoscopy;  Laterality: N/A;   ESOPHAGEAL DILATION  08/16/2020   Procedure: ESOPHAGEAL DILATION;  Surgeon: Midge Minium, MD;  Location: [facility];  Service: Endoscopy;;   ESOPHAGOGASTRODUODENOSCOPY (EGD) WITH PROPOFOL N/A 08/16/2020   Procedure: ESOPHAGOGASTRODUODENOSCOPY (EGD) WITH PROPOFOL;  Surgeon: Midge Minium, MD;  Location: [facility];  Service: Endoscopy;  Laterality: N/A;   GIVENS CAPSULE STUDY N/A 09/18/2020   Procedure: GIVENS CAPSULE STUDY;  Surgeon: Midge Minium, MD;  Location: [facility];  Service: Endoscopy;  Laterality: N/A;   HEMORRHOID SURGERY     INNER EAR SURGERY     LUMBAR FUSION  2011    Patient Active Problem List   Diagnosis Date Noted   Pharyngoesophageal dysphagia 03/29/2021   SOBOE (shortness of breath on exertion) 10/09/2020   Iron deficiency anemia due to chronic blood loss    Stricture and stenosis of esophagus    Class 1 obesity due to excess calories  without serious comorbidity with body mass index (BMI) of 30.0 to 30.9 in adult 05/12/2019   Anxiety and depression 05/12/2019   Gastroesophageal reflux disease 05/12/2019   Hyperlipidemia 05/12/2019   Leukopenia 05/12/2019   Vitamin D deficiency 05/12/2019   Essential hypertension 01/07/2018   History of cholesteatoma 06/10/2012   Mixed conductive and sensorineural hearing loss of left ear 06/10/2012    Family History  Problem Relation Age of Onset   Hypertension Mother    Cancer Maternal Grandfather     Social History   Tobacco Use   Smoking status: Former    Current packs/day: 0.00    Average packs/day: 0.5 packs/day for 20.0 years (10.0 ttl pk-yrs)    Types: Cigarettes    Start date: 04/14/2000    Quit date: 04/14/2020    Years since quitting: 2.6   Smokeless tobacco: Never  Substance Use Topics   Alcohol use: Yes    Alcohol/week: 6.0 - 10.0 standard drinks of alcohol    Types: 6 - 10 Standard drinks or equivalent per week    Comment: 3-7 airplane bottles vodka approx 3 days per week 12/26/20   No Known Allergies  Current Meds  Medication Sig   amLODipine (NORVASC) 10 MG tablet TAKE 1 TABLET(10 MG) BY MOUTH DAILY   atorvastatin (LIPITOR) 20 MG tablet Take 1 tablet (20 mg total) by mouth daily. Take 1 tablet by mouth daily.   hydrALAZINE (APRESOLINE) 10 MG tablet TAKE 1 TABLET(10 MG) BY MOUTH THREE TIMES DAILY   losartan (COZAAR) 100 MG tablet TAKE 1 TABLET(100 MG) BY MOUTH DAILY   omeprazole (PRILOSEC) 40 MG capsule TAKE 1 CAPSULE(40 MG) BY MOUTH EVERY MORNING   sertraline (ZOLOFT) 100 MG tablet TAKE 2 TABLETS(200 MG) BY MOUTH DAILY    Immunization History  Administered Date(s) Administered   Influenza,inj,Quad PF,6+ Mos 06/10/2018, 03/20/2022   Influenza-Unspecified 02/17/2019, 03/12/2020, 02/24/2021   Janssen (J&J) SARS-COV-2 Vaccination 07/17/2019   Moderna Sars-Covid-2 Vaccination 03/12/2020, 02/24/2021   Tdap 05/12/2019        Objective:   BP 128/82 (BP  Location: Right Arm, Cuff Size: Normal)   Pulse 69   Temp 98.1 F (36.7 C)   Ht 5\' 10"  (1.778 m)   Wt 187 lb 12.8 oz (85.2 kg)   SpO2 97%   BMI 26.95 kg/m   SpO2: 97 % O2 Device: None (Room air)  GENERAL: Well-developed, overweight gentleman, no acute distress, fully ambulatory, no conversational dyspnea. HEAD: Normocephalic, atraumatic.  EYES: Pupils equal, round, reactive to light.  No scleral icterus.  MOUTH: Mucosa moist, no thrush. NECK: Supple. No thyromegaly. Trachea midline. No JVD.  No adenopathy. PULMONARY: Good air entry bilaterally.  No adventitious sounds. CARDIOVASCULAR: S1 and S2. Regular rate and rhythm.  Grade 1/6 systolic ejection murmur left sternal border. ABDOMEN: Benign. MUSCULOSKELETAL: No joint deformity, no clubbing, no edema.  NEUROLOGIC:  No overt focal deficit, no gait disturbance, speech is fluent. SKIN: Intact,warm,dry. PSYCH: Mood and behavior normal.   Assessment & Plan:     ICD-10-CM   1. Shortness of breath  R06.02 DG Chest 2 View    Pulmonary Function Test [facility] Only   Will assess with PFTs    2. Hiatal hernia  K44.9    This is a fairly large hiatal hernia Consider GI evaluation Esophageal thickening noted on prior CT    3. Cavitary lesion of lung  J98.4    This had resolved on follow-up chest x-ray and CT chest Obtain chest x-ray today     Orders Placed This Encounter  Procedures   DG Chest 2 View    Standing Status:   Future    Number of Occurrences:   1    Standing Expiration Date:   12/10/2023    Order Specific Question:   Reason for Exam (SYMPTOM  OR DIAGNOSIS REQUIRED)    Answer:   SOB    Order Specific Question:   Preferred imaging location?    Answer:   [facility]   Pulmonary Function Test [facility] Only    Standing Status:   Future    Standing Expiration Date:   12/10/2023    Order Specific Question:   Full PFT: includes the following: basic spirometry, spirometry pre & post bronchodilator, diffusion capacity (DLCO),  lung volumes    Answer:   Full PFT    Order Specific Question:   This test can only be performed at    Answer:   [facility]   Will obtain chest x-ray today.  Will schedule patient for pulmonary function testing.  Will see the patient in follow-up in 6 to 8 weeks time call sooner should any new problems arise.   Gailen Shelter, MD Advanced Bronchoscopy P[facility]M [facility]    *This note was dictated using voice recognition software/Dragon.  Despite best efforts to proofread, errors can occur which can change the meaning. Any transcriptional errors that result from this process are unintentional and may not be fully corrected at the time of dictation.

## 2022-12-16 ENCOUNTER — Other Ambulatory Visit: Payer: Self-pay

## 2022-12-16 DIAGNOSIS — K222 Esophageal obstruction: Secondary | ICD-10-CM

## 2022-12-16 DIAGNOSIS — R1310 Dysphagia, unspecified: Secondary | ICD-10-CM

## 2022-12-17 ENCOUNTER — Other Ambulatory Visit: Payer: Self-pay | Admitting: Family Medicine

## 2022-12-17 ENCOUNTER — Encounter: Payer: Self-pay | Admitting: Gastroenterology

## 2022-12-17 DIAGNOSIS — I10 Essential (primary) hypertension: Secondary | ICD-10-CM

## 2022-12-19 ENCOUNTER — Other Ambulatory Visit: Payer: Self-pay | Admitting: Family Medicine

## 2022-12-19 ENCOUNTER — Ambulatory Visit: Admission: RE | Admit: 2022-12-19 | Payer: Medicare PPO | Source: Ambulatory Visit | Admitting: Gastroenterology

## 2022-12-19 DIAGNOSIS — K219 Gastro-esophageal reflux disease without esophagitis: Secondary | ICD-10-CM

## 2022-12-19 HISTORY — DX: Dysphagia, unspecified: R13.10

## 2022-12-19 HISTORY — DX: Diaphragmatic hernia without obstruction or gangrene: K44.9

## 2022-12-19 HISTORY — DX: Cardiac murmur, unspecified: R01.1

## 2022-12-19 HISTORY — DX: Abnormal weight loss: R63.4

## 2022-12-19 HISTORY — DX: Iron deficiency anemia, unspecified: D50.9

## 2022-12-19 HISTORY — DX: Celiac disease: K90.0

## 2022-12-19 HISTORY — DX: Esophageal obstruction: K22.2

## 2022-12-19 HISTORY — DX: Fatty (change of) liver, not elsewhere classified: K76.0

## 2022-12-19 SURGERY — ESOPHAGOGASTRODUODENOSCOPY (EGD)
Anesthesia: General

## 2022-12-22 ENCOUNTER — Encounter: Payer: Self-pay | Admitting: Emergency Medicine

## 2022-12-22 ENCOUNTER — Emergency Department: Payer: Medicare PPO

## 2022-12-22 ENCOUNTER — Emergency Department
Admission: EM | Admit: 2022-12-22 | Discharge: 2022-12-22 | Disposition: A | Discharge status: Home / Self Care | Payer: Medicare PPO | Attending: Emergency Medicine | Admitting: Emergency Medicine

## 2022-12-22 ENCOUNTER — Other Ambulatory Visit: Payer: Self-pay

## 2022-12-22 DIAGNOSIS — K5792 Diverticulitis of intestine, part unspecified, without perforation or abscess without bleeding: Secondary | ICD-10-CM

## 2022-12-22 DIAGNOSIS — R109 Unspecified abdominal pain: Secondary | ICD-10-CM

## 2022-12-22 DIAGNOSIS — K449 Diaphragmatic hernia without obstruction or gangrene: Secondary | ICD-10-CM | POA: Diagnosis not present

## 2022-12-22 DIAGNOSIS — K5732 Diverticulitis of large intestine without perforation or abscess without bleeding: Secondary | ICD-10-CM | POA: Insufficient documentation

## 2022-12-22 LAB — CBC
HCT: 42.6 % (ref 39.0–52.0)
Hemoglobin: 13.9 g/dL (ref 13.0–17.0)
MCH: 30.3 pg (ref 26.0–34.0)
MCHC: 32.6 g/dL (ref 30.0–36.0)
MCV: 92.8 fL (ref 80.0–100.0)
Platelets: 129 10*3/uL — ABNORMAL LOW (ref 150–400)
RBC: 4.59 MIL/uL (ref 4.22–5.81)
RDW: 17.1 % — ABNORMAL HIGH (ref 11.5–15.5)
WBC: 7.2 10*3/uL (ref 4.0–10.5)
nRBC: 0 % (ref 0.0–0.2)

## 2022-12-22 LAB — COMPREHENSIVE METABOLIC PANEL
ALT: 48 U/L — ABNORMAL HIGH (ref 0–44)
AST: 46 U/L — ABNORMAL HIGH (ref 15–41)
Albumin: 4.6 g/dL (ref 3.5–5.0)
Alkaline Phosphatase: 73 U/L (ref 38–126)
Anion gap: 13 (ref 5–15)
BUN: 14 mg/dL (ref 6–20)
CO2: 24 mmol/L (ref 22–32)
Calcium: 9.8 mg/dL (ref 8.9–10.3)
Chloride: 102 mmol/L (ref 98–111)
Creatinine, Ser: 0.66 mg/dL (ref 0.61–1.24)
GFR, Estimated: >60 mL/min (ref >60)
Glucose, Bld: 117 mg/dL — ABNORMAL HIGH (ref 70–99)
Potassium: 3.6 mmol/L (ref 3.5–5.1)
Sodium: 139 mmol/L (ref 135–145)
Total Bilirubin: 1.1 mg/dL (ref 0.3–1.2)
Total Protein: 7.8 g/dL (ref 6.5–8.1)

## 2022-12-22 LAB — URINALYSIS, ROUTINE W REFLEX MICROSCOPIC
Bilirubin Urine: NEGATIVE
Glucose, UA: NEGATIVE mg/dL
Hgb urine dipstick: NEGATIVE
Ketones, ur: 5 mg/dL — AB
Leukocytes,Ua: NEGATIVE
Nitrite: NEGATIVE
Protein, ur: NEGATIVE mg/dL
Specific Gravity, Urine: 1.017 (ref 1.005–1.030)
pH: 6 (ref 5.0–8.0)

## 2022-12-22 LAB — LIPASE, BLOOD: Lipase: 38 U/L (ref 11–51)

## 2022-12-22 MED ORDER — ACETAMINOPHEN 500 MG PO TABS
1000.0000 mg | ORAL_TABLET | Freq: Once | ORAL | Status: AC
  Administered 2022-12-22: 1000 mg via ORAL
  Filled 2022-12-22: qty 2

## 2022-12-22 MED ORDER — OXYCODONE HCL 5 MG PO TABS: 5.0000 mg | ORAL_TABLET | Freq: Three times a day (TID) | ORAL | 0 refills | Status: DC | PRN

## 2022-12-22 MED ORDER — OXYCODONE HCL 5 MG PO TABS
5.0000 mg | ORAL_TABLET | Freq: Once | ORAL | Status: AC
  Administered 2022-12-22: 5 mg via ORAL
  Filled 2022-12-22: qty 1

## 2022-12-22 MED ORDER — AMOXICILLIN-POT CLAVULANATE 875-125 MG PO TABS: 1.0000 | ORAL_TABLET | Freq: Two times a day (BID) | ORAL | 0 refills | Status: AC

## 2022-12-22 MED ORDER — AMOXICILLIN-POT CLAVULANATE 875-125 MG PO TABS
1.0000 | ORAL_TABLET | Freq: Once | ORAL | Status: AC
  Administered 2022-12-22: 1 via ORAL
  Filled 2022-12-22: qty 1

## 2022-12-22 MED ORDER — KETOROLAC TROMETHAMINE 30 MG/ML IJ SOLN
30.0000 mg | Freq: Once | INTRAMUSCULAR | Status: AC
  Administered 2022-12-22: 30 mg via INTRAMUSCULAR
  Filled 2022-12-22: qty 1

## 2022-12-22 NOTE — Discharge Instructions (Signed)
Take Augmentin antibiotic for diverticulitis for the full 5 days, a mild infection/inflammation of your large intestine.  Take acetaminophen 650 mg and ibuprofen 400 mg every 6 hours for pain.  Take with food. Take oxycodone 5 mg every 8 hours as needed for severe/breakthrough pain only.   Thank you for choosing [place] for your health care today!  Please see your primary doctor this week for a follow up appointment.   If you have any new, worsening, or unexpected symptoms call your doctor right away or come back to the emergency department for reevaluation.  It was my pleasure to care for you today.   Daneil Dan Modesto Charon, MD

## 2022-12-22 NOTE — Telephone Encounter (Signed)
Requested Prescriptions  Pending Prescriptions Disp Refills   omeprazole (PRILOSEC) 40 MG capsule [Pharmacy Med Name: OMEPRAZOLE 40MG  CAPSULES] 90 capsule 0    Sig: TAKE 1 CAPSULE(40 MG) BY MOUTH EVERY MORNING     Gastroenterology: Proton Pump Inhibitors Passed - 12/19/2022 11:20 AM      Passed - Valid encounter within last 12 months    Recent Outpatient Visits           3 months ago Essential hypertension   [place] [facility]   9 months ago Iron deficiency anemia due to chronic blood loss   [facility]   9 months ago Essential hypertension   [place] [facility]   1 year ago Essential hypertension   [place] [facility]   1 year ago DOE (dyspnea on exertion)   [facility], [facility]       Future Appointments             In 3 months Duanne Limerick, MD [facility] at [facility], [facility]

## 2022-12-22 NOTE — Group Note (Deleted)

## 2022-12-22 NOTE — ED Triage Notes (Signed)
Pt here with left flank pain x2 days. Pt denies NVD. Pt states pain is constant just getting worse in nature. Pt denies hx of kidney stones.

## 2022-12-22 NOTE — ED Provider Notes (Signed)
[facility] Provider Note    Event Date/Time   First MD Initiated Contact with Patient 12/22/22 [phone]     (approximate)   History   Flank Pain   HPI  Chad Hudson is a 62 y.o. male   Past medical history of pretension, hyperlipidemia, depression and anxiety, GERD, alcohol use, iron deficient anemia who presents to the emergency department with left flank pain.  Ongoing for the last 2 days, started as mild but worsening.  Intermittent sharp pains nonradiating.  No associated dysuria or frequency, hematuria, or testicular pain.  He has been constipated and straining on the toilet recently.  He has been passing gas.  No history of abdominal surgeries.  Occasional slight blood on toilet paper after straining, associated with known hemorrhoid.  Otherwise no significant GI bleeding.  He is a daily drinker drinking 2-3 vodka drinks per day.  No history of kidney stones.  No obvious inciting event, twisting or injury.  No rash.  External Medical Documents Reviewed: CT scan obtained in June 2024 for unintended weight loss, found fatty liver, large hiatal hernia, and diverticula in the colon      Physical Exam   Triage Vital Signs: ED Triage Vitals  Encounter Vitals Group     BP 12/22/22 0808 (!) 196/106     Systolic BP Percentile --      Diastolic BP Percentile --      Pulse Rate 12/22/22 0806 84     Resp 12/22/22 0806 17     Temp 12/22/22 0806 98 F (36.7 C)     Temp Source 12/22/22 0806 Oral     SpO2 12/22/22 0806 98 %     Weight 12/22/22 0807 187 lb 13.3 oz (85.2 kg)     Height 12/22/22 0807 5\' 10"  (1.778 m)     Head Circumference --      Peak Flow --      Pain Score 12/22/22 0807 6     Pain Loc --      Pain Education --      Exclude from Growth Chart --     Most recent vital signs: Vitals:   12/22/22 0806 12/22/22 0808  BP:  (!) 196/106  Pulse: 84   Resp: 17   Temp: 98 F (36.7 C)   SpO2: 98%     General: Awake, no distress.   CV:  Good peripheral perfusion.  Resp:  Normal effort.  Abd:  No distention.  Other:  No skin changes to the left flank or abdomen or back.  Tenderness to palpation along the left flank.  No midline tenderness.  Motor intact bilateral upper and lower extremities.  The remainder of the abdomen is soft and nontender no rigidity or guarding no masses.   ED Results / Procedures / Treatments   Labs (all labs ordered are listed, but only abnormal results are displayed) Labs Reviewed  COMPREHENSIVE METABOLIC PANEL - Abnormal; Notable for the following components:      Result Value   Glucose, Bld 117 (*)    AST 46 (*)    ALT 48 (*)    All other components within normal limits  CBC - Abnormal; Notable for the following components:   RDW 17.1 (*)    Platelets 129 (*)    All other components within normal limits  URINALYSIS, ROUTINE W REFLEX MICROSCOPIC - Abnormal; Notable for the following components:   Color, Urine YELLOW (*)    APPearance CLEAR (*)  Ketones, ur 5 (*)    All other components within normal limits  LIPASE, BLOOD     I ordered and reviewed the above labs they are notable for white cell count is normal, H&H is normal, and his urine does not appear infected    RADIOLOGY I independently reviewed and interpreted CT of the abdomen pelvis and see no obvious obstructive or inflammatory changes I also reviewed radiologist's formal read.   PROCEDURES:  Critical Care performed: No  Procedures   MEDICATIONS ORDERED IN ED: Medications  amoxicillin-clavulanate (AUGMENTIN) 875-125 MG per tablet 1 tablet (has no administration in time range)  oxyCODONE (Oxy IR/ROXICODONE) immediate release tablet 5 mg (5 mg Oral Given 12/22/22 0858)  acetaminophen (TYLENOL) tablet 1,000 mg (1,000 mg Oral Given 12/22/22 0858)  ketorolac (TORADOL) 30 MG/ML injection 30 mg (30 mg Intramuscular Given 12/22/22 0859)     IMPRESSION / MDM / ASSESSMENT AND PLAN / ED COURSE  I reviewed the triage  vital signs and the nursing notes.                                Patient's presentation is most consistent with acute presentation with potential threat to life or bodily function.  Differential diagnosis includes, but is not limited to, ureterolithiasis, pyelonephritis, diverticulitis, shingles, constipation/gas related pains, perforated viscus, intra-abdominal infection like appendicitis or musculoskeletal pain   The patient is on the cardiac monitor to evaluate for evidence of arrhythmia and/or significant heart rate changes.  MDM:    Left-sided flank pain intermittent severe over the last several days concerning for either ureterolithiasis, infection,  or very well may be related to gas pain/constipation.  Labs unremarkable, urinalysis obtained to rule out urinary tract infection does not look infected.  CT scan of the abdomen pelvis renal stone study to look for kidney stones or other surgical abdominal pathologies. Considered shingles but no skin rash no allodynia doubtful at this time.  Pain control with IM Toradol, oxycodone, Tylenol.  CT formal read shows mild uncomplicated diverticulitis.  Augmentin prescription.    Considered inpatient treatment with admission, IV antibiotics however looks well, doubt sepsis, outpatient therapy. discharged with PMD follow-up.         FINAL CLINICAL IMPRESSION(S) / ED DIAGNOSES   Final diagnoses:  Left flank pain  Diverticulitis     Rx / DC Orders   ED Discharge Orders          Ordered    amoxicillin-clavulanate (AUGMENTIN) 875-125 MG tablet  2 times daily        12/22/22 0912    oxyCODONE (ROXICODONE) 5 MG immediate release tablet  Every 8 hours PRN        12/22/22 0914             Note:  This document was prepared using Dragon voice recognition software and may include unintentional dictation errors.    Pilar Jarvis, MD 12/22/22 [phone]

## 2022-12-29 ENCOUNTER — Telehealth: Payer: Self-pay | Admitting: *Deleted

## 2022-12-29 NOTE — Transitions of Care (Post Inpatient/ED Visit) (Signed)
12/29/2022  Name: Chad Hudson MRN: [id number] DOB: [birth year]  Today's TOC FU Call Status: Today's TOC FU Call Status:: Successful TOC FU Call Completed TOC FU Call Complete Date: 12/29/22 Patient's Name and Date of Birth confirmed.  Transition Care Management Follow-up Telephone Call Date of Discharge: 12/23/22 Discharge Facility: [facility] Type of Discharge: Emergency Department Reason for ED Visit: Other: (diverticulitis) How have you been since you were released from the hospital?: Better Any questions or concerns?: No  Items Reviewed: Did you receive and understand the discharge instructions provided?: Yes Medications obtained,verified, and reconciled?: Yes (Medications Reviewed) Any new allergies since your discharge?: No Dietary orders reviewed?: No Do you have support at home?: Yes People in Home: parent(s) Name of Support/Comfort Primary Source: mother Chad Hudson  Medications Reviewed Today: Medications Reviewed Today     Reviewed by Luella Cook, RN (Case Manager) on 12/29/22 at 1615  Med List Status: <None>   Medication Order Taking? Sig Documenting Provider Last Dose Status Informant  albuterol (VENTOLIN HFA) 108 (90 Base) MCG/ACT inhaler [id number] No Inhale 2 puffs into the lungs every 6 (six) hours as needed for wheezing or shortness of breath.  Patient not taking: Reported on 12/08/2022   Chad Limerick, MD Not Taking Active   amLODipine (NORVASC) 10 MG tablet [id number] Yes TAKE 1 TABLET(10 MG) BY MOUTH DAILY Chad Limerick, MD Taking Active   atorvastatin (LIPITOR) 20 MG tablet [id number] Yes Take 1 tablet (20 mg total) by mouth daily. Take 1 tablet by mouth daily. Chad Limerick, MD Taking Active   ferrous sulfate 325 (65 FE) MG tablet [id number]  Take 1 tablet (325 mg total) by mouth daily. Chesley Noon, MD  Expired 09/16/22 2359   hydrALAZINE (APRESOLINE) 10 MG tablet [id number] Yes TAKE 1 TABLET(10 MG) BY MOUTH THREE  TIMES DAILY Chad Limerick, MD Taking Active   losartan (COZAAR) 100 MG tablet [id number] Yes TAKE 1 TABLET(100 MG) BY MOUTH DAILY Chad Limerick, MD Taking Active   omeprazole (PRILOSEC) 40 MG capsule [id number] Yes TAKE 1 CAPSULE(40 MG) BY MOUTH EVERY MORNING Chad Limerick, MD Taking Active   oxyCODONE (ROXICODONE) 5 MG immediate release tablet [id number] Yes Take 1 tablet (5 mg total) by mouth every 8 (eight) hours as needed for up to 12 doses for breakthrough pain or severe pain. Pilar Jarvis, MD Taking Active   sertraline (ZOLOFT) 100 MG tablet [id number] Yes TAKE 2 TABLETS(200 MG) BY MOUTH DAILY Chad Limerick, MD Taking Active   Vitamin D, Ergocalciferol, (DRISDOL) 1.25 MG (50000 UNIT) CAPS capsule [id number] No Take 1 capsule (50,000 Units total) by mouth every 7 (seven) days.  Patient not taking: Reported on 12/08/2022   Celso Amy, PA-C Not Taking Active             Home Care and Equipment/Supplies: Were Home Health Services Ordered?: NA  Functional Questionnaire: Do you need assistance with bathing/showering or dressing?: No Do you need assistance with meal preparation?: No Do you need assistance with eating?: No Do you have difficulty maintaining continence: No Do you need assistance with getting out of bed/getting out of a chair/moving?: No Do you have difficulty managing or taking your medications?: No  Follow up appointments reviewed: PCP Follow-up appointment confirmed?: No MD Provider Line [phone] Given: No (Per patient he will schedule himself) Specialist Hospital Follow-up appointment confirmed?: NA Do you need transportation to your follow-up appointment?: No (Patiet stated that he is having some car trouble but  he is getting it fixed) Do you understand care options if your condition(s) worsen?: Yes-patient verbalized understanding  SDOH Interventions Today    Flowsheet Row Most Recent Value  SDOH Interventions   Food Insecurity Interventions  Intervention Not Indicated  Housing Interventions Intervention Not Indicated  Transportation Interventions Intervention Not Indicated      Interventions Today    Flowsheet Row Most Recent Value  General Interventions   General Interventions Discussed/Reviewed General Interventions Discussed, General Interventions Reviewed, Doctor Visits  Doctor Visits Discussed/Reviewed Doctor Visits Discussed, PCP  [patient refused RN to call and make appt. He stated he would call]  PCP/Specialist Visits Compliance with follow-up visit  Pharmacy Interventions   Pharmacy Dicussed/Reviewed Pharmacy Topics Discussed  [Patient took all of [facility]]      TOC Interventions Today    Flowsheet Row Most Recent Value  TOC Interventions   TOC Interventions Discussed/Reviewed TOC Interventions Discussed, TOC Interventions Reviewed       Gean Maidens BSN RN [facility] [phone]

## 2023-01-06 ENCOUNTER — Ambulatory Visit: Payer: Medicare PPO

## 2023-01-06 DIAGNOSIS — Z0000 Encounter for general adult medical examination without abnormal findings: Secondary | ICD-10-CM

## 2023-01-06 NOTE — Patient Instructions (Addendum)
Chad Hudson , Thank you for taking time to come for your Medicare Wellness Visit. I appreciate your ongoing commitment to your health goals. Please review the following plan we discussed and let me know if I can assist you in the future.   Referrals/Orders/Follow-Ups/Clinician Recommendations: none  This is a list of the screening recommended for you and due dates:  Health Maintenance  Topic Date Due   HIV Screening  Never done   Hepatitis C Screening  Never done   Zoster (Shingles) Vaccine (1 of 2) Never done   Flu Shot  11/13/2022   COVID-19 Vaccine (4 - 2023-24 season) 12/14/2022   Medicare Annual Wellness Visit  01/06/2024   Colon Cancer Screening  08/16/2025   DTaP/Tdap/Td vaccine (2 - Td or Tdap) 05/11/2029   HPV Vaccine  Aged Out    Advanced directives: (ACP Link)Information on Advanced Care Planning can be found at [facility] Advance Health Care Directives Advance Health Care Directives ([email or web address]   Next Medicare Annual Wellness Visit scheduled for next year: Yes   01/12/24 @ 11:15 am by video

## 2023-01-06 NOTE — Progress Notes (Signed)
Subjective:   Chad Hudson is a 62 y.o. male who presents for Medicare Annual/Subsequent preventive examination.  Visit Complete: Virtual  I connected with  [name] on 01/06/23 by a audio enabled telemedicine application and verified that I am speaking with the correct person using two identifiers.  Patient Location: Home  Provider Location: Office/Clinic  I discussed the limitations of evaluation and management by telemedicine. The patient expressed understanding and agreed to proceed.  Vital Signs: Unable to obtain new vitals due to this being a telehealth visit.     Objective:    Today's Vitals   01/06/23 1128  PainSc: 0-No pain   There is no height or weight on file to calculate BMI.     01/06/2023   11:32 AM 12/22/2022    8:09 AM 03/20/2022    6:21 PM 12/31/2021   11:14 AM 12/26/2020    1:41 PM 08/16/2020    8:36 AM  Advanced Directives  Does Patient Have a Medical Advance Directive? No No No No No No  Would patient like information on creating a medical advance directive? No - Patient declined No - Patient declined  No - Patient declined Yes (MAU/Ambulatory/Procedural Areas - Information given) No - Patient declined    Current Medications (verified) Outpatient Encounter Medications as of 01/06/2023  Medication Sig   amLODipine (NORVASC) 10 MG tablet TAKE 1 TABLET(10 MG) BY MOUTH DAILY   atorvastatin (LIPITOR) 20 MG tablet Take 1 tablet (20 mg total) by mouth daily. Take 1 tablet by mouth daily.   hydrALAZINE (APRESOLINE) 10 MG tablet TAKE 1 TABLET(10 MG) BY MOUTH THREE TIMES DAILY   losartan (COZAAR) 100 MG tablet TAKE 1 TABLET(100 MG) BY MOUTH DAILY   omeprazole (PRILOSEC) 40 MG capsule TAKE 1 CAPSULE(40 MG) BY MOUTH EVERY MORNING   sertraline (ZOLOFT) 100 MG tablet TAKE 2 TABLETS(200 MG) BY MOUTH DAILY   albuterol (VENTOLIN HFA) 108 (90 Base) MCG/ACT inhaler Inhale 2 puffs into the lungs every 6 (six) hours as needed for wheezing or shortness of breath.  (Patient not taking: Reported on 01/06/2023)   ferrous sulfate 325 (65 FE) MG tablet Take 1 tablet (325 mg total) by mouth daily.   oxyCODONE (ROXICODONE) 5 MG immediate release tablet Take 1 tablet (5 mg total) by mouth every 8 (eight) hours as needed for up to 12 doses for breakthrough pain or severe pain. (Patient not taking: Reported on 01/06/2023)   Vitamin D, Ergocalciferol, (DRISDOL) 1.25 MG (50000 UNIT) CAPS capsule Take 1 capsule (50,000 Units total) by mouth every 7 (seven) days. (Patient not taking: Reported on 12/08/2022)   No facility-administered encounter medications on file as of 01/06/2023.    Allergies (verified) Patient has no known allergies.   History: Past Medical History:  Diagnosis Date   Alcoholism (HCC)    Anemia    Anxiety    Celiac disease    Depression    Dysphagia    Esophageal stricture    Fatty liver    GERD (gastroesophageal reflux disease)    Hyperlipidemia    Hypertension    Iron deficiency anemia    Large hiatal hernia    Systolic ejection murmur    Weight loss    Past Surgical History:  Procedure Laterality Date   COLONOSCOPY WITH PROPOFOL N/A 08/16/2020   Procedure: COLONOSCOPY WITH PROPOFOL;  Surgeon: Midge Minium, MD;  Location: [facility];  Service: Endoscopy;  Laterality: N/A;   ESOPHAGEAL DILATION  08/16/2020   Procedure: ESOPHAGEAL DILATION;  Surgeon: Midge Minium, MD;  Location: [facility];  Service: Endoscopy;;   ESOPHAGOGASTRODUODENOSCOPY (EGD) WITH PROPOFOL N/A 08/16/2020   Procedure: ESOPHAGOGASTRODUODENOSCOPY (EGD) WITH PROPOFOL;  Surgeon: Midge Minium, MD;  Location: [facility];  Service: Endoscopy;  Laterality: N/A;   GIVENS CAPSULE STUDY N/A 09/18/2020   Procedure: GIVENS CAPSULE STUDY;  Surgeon: Midge Minium, MD;  Location: [facility] END[facility];  Service: Endoscopy;  Laterality: N/A;   HEMORRHOID SURGERY     INNER EAR SURGERY     LUMBAR FUSION  2011   Family History  Problem Relation Age of Onset    Hypertension Mother    Cancer Maternal Grandfather    Social History   Socioeconomic History   Marital status: Divorced    Spouse name: Not on file   Number of children: 2   Years of education: Not on file   Highest education level: Not on file  Occupational History   Occupation: Disabled  Tobacco Use   Smoking status: Former    Current packs/day: 0.00    Average packs/day: 0.5 packs/day for 20.0 years (10.0 ttl pk-yrs)    Types: Cigarettes    Start date: 04/14/2000    Quit date: 04/14/2020    Years since quitting: 2.7   Smokeless tobacco: Never  Vaping Use   Vaping status: Never Used  Substance and Sexual Activity   Alcohol use: Yes    Alcohol/week: 6.0 - 10.0 standard drinks of alcohol    Types: 6 - 10 Standard drinks or equivalent per week    Comment: 3-7 airplane bottles vodka approx 3 days per week 12/26/20   Drug use: Yes    Types: Marijuana   Sexual activity: Not Currently  Other Topics Concern   Not on file  Social History Narrative   Pt lives with his mother and helps take care of her   Social Determinants of Health   Financial Resource Strain: Low Risk  (01/06/2023)   Overall Financial Resource Strain (CARDIA)    Difficulty of Paying Living Expenses: Not hard at all  Food Insecurity: No Food Insecurity (01/06/2023)   Hunger Vital Sign    Worried About Running Out of Food in the Last Year: Never true    Ran Out of Food in the Last Year: Never true  Transportation Needs: No Transportation Needs (01/06/2023)   PRAPARE - Administrator, Civil Service (Medical): No    Lack of Transportation (Non-Medical): No  Physical Activity: Inactive (01/06/2023)   Exercise Vital Sign    Days of Exercise per Week: 0 days    Minutes of Exercise per Session: 0 min  Stress: No Stress Concern Present (01/06/2023)   [employer] of Occupational Health - Occupational Stress Questionnaire    Feeling of Stress : Not at all  Social Connections: Socially Isolated  (01/06/2023)   Social Connection and Isolation Panel [NHANES]    Frequency of Communication with Friends and Family: More than three times a week    Frequency of Social Gatherings with Friends and Family: More than three times a week    Attends Religious Services: Never    Database administrator or Organizations: No    Attends Engineer, structural: Never    Marital Status: Divorced    Tobacco Counseling Counseling given: Not Answered   Clinical Intake:  Pre-visit preparation completed: Yes  Pain : No/denies pain Pain Score: 0-No pain     Nutritional Status: BMI 25 -29 Overweight Nutritional Risks: None Diabetes: No  How often do you need to have someone help you when you read instructions, pamphlets, or other written materials from your doctor or pharmacy?: 1 - Never  Interpreter Needed?: No  Information entered by :: Kennedy Bucker, LPN   Activities of Daily Living    01/06/2023   11:34 AM  In your present state of health, do you have any difficulty performing the following activities:  Hearing? 1  Vision? 0  Difficulty concentrating or making decisions? 0  Walking or climbing stairs? 0  Dressing or bathing? 0  Doing errands, shopping? 0  Preparing Food and eating ? N  Using the Toilet? N  In the past six months, have you accidently leaked urine? N  Do you have problems with loss of bowel control? N  Managing your Medications? N  Managing your Finances? N  Housekeeping or managing your Housekeeping? N    Patient Care Team: Duanne Limerick, MD as PCP - General (Family Medicine) Midge Minium, MD as Consulting Physician (Gastroenterology)  Indicate any recent Medical Services you may have received from other than [facility] providers in the past year (date may be approximate).     Assessment:   This is a routine wellness examination for [employer].  Hearing/Vision screen Hearing Screening - Comments:: Had one, but hasn't worn in a long time Vision Screening -  Comments:: Readers-    Goals Addressed             This Visit's Progress    DIET - EAT MORE FRUITS AND VEGETABLES         Depression Screen    01/06/2023   11:31 AM 09/16/2022    9:20 AM 03/27/2022    1:47 PM 03/20/2022   10:51 AM 01/03/2022    1:28 PM 01/03/2022    1:24 PM 09/11/2021   10:22 AM  PHQ 2/9 Scores  PHQ - 2 Score 0 0 0 0 0 0 1  PHQ- 9 Score 0 0 0 0 0  4    Fall Risk    01/06/2023   11:33 AM 09/16/2022    9:20 AM 03/27/2022    1:47 PM 03/20/2022   10:51 AM 01/03/2022    1:26 PM  Fall Risk   Falls in the past year? 0 0 0 0 1  Number falls in past yr: 0 0 0 0 0  Injury with Fall? 0 0 0 0 1  Risk for fall due to : No Fall Risks No Fall Risks No Fall Risks No Fall Risks Other (Comment);Impaired balance/gait  Follow up Falls prevention discussed;Falls evaluation completed Falls evaluation completed Falls evaluation completed Falls evaluation completed Falls evaluation completed    MEDICARE RISK AT HOME: Medicare Risk at Home Any stairs in or around the home?: No If so, are there any without handrails?: No Home free of loose throw rugs in walkways, pet beds, electrical cords, etc?: Yes Adequate lighting in your home to reduce risk of falls?: Yes Life alert?: No Use of a cane, walker or w/c?: No Grab bars in the bathroom?: No Shower chair or bench in shower?: Yes Elevated toilet seat or a handicapped toilet?: No  TIMED UP AND GO:  Was the test performed?  No    Cognitive Function:        01/06/2023   11:35 AM 01/03/2022    1:28 PM  6CIT Screen  What Year? 0 points 0 points  What month? 0 points 0 points  What time? 0 points 0 points  Count  back from 20 0 points 0 points  Months in reverse 0 points 0 points  Repeat phrase 2 points 0 points  Total Score 2 points 0 points    Immunizations Immunization History  Administered Date(s) Administered   Influenza,inj,Quad PF,6+ Mos 06/10/2018, 03/20/2022   Influenza-Unspecified 02/17/2019, 03/12/2020,  02/24/2021   Janssen (J&J) SARS-COV-2 Vaccination 07/17/2019   Moderna Sars-Covid-2 Vaccination 03/12/2020, 02/24/2021   Tdap 05/12/2019    TDAP status: Up to date  Flu Vaccine status: Up to date  Pneumococcal vaccine status: Declined,  Education has been provided regarding the importance of this vaccine but patient still declined. Advised may receive this vaccine at local pharmacy or Health Dept. Aware to provide a copy of the vaccination record if obtained from local pharmacy or Health Dept. Verbalized acceptance and understanding.   Covid-19 vaccine status: Completed vaccines  Qualifies for Shingles Vaccine? Yes   Zostavax completed No   Shingrix Completed?: No.    Education has been provided regarding the importance of this vaccine. Patient has been advised to call insurance company to determine out of pocket expense if they have not yet received this vaccine. Advised may also receive vaccine at local pharmacy or Health Dept. Verbalized acceptance and understanding.  Screening Tests Health Maintenance  Topic Date Due   HIV Screening  Never done   Hepatitis C Screening  Never done   Zoster Vaccines- Shingrix (1 of 2) Never done   INFLUENZA VACCINE  11/13/2022   COVID-19 Vaccine (4 - 2023-24 season) 12/14/2022   Medicare Annual Wellness (AWV)  01/06/2024   Colonoscopy  08/16/2025   DTaP/Tdap/Td (2 - Td or Tdap) 05/11/2029   HPV VACCINES  Aged Out    Health Maintenance  Health Maintenance Due  Topic Date Due   HIV Screening  Never done   Hepatitis C Screening  Never done   Zoster Vaccines- Shingrix (1 of 2) Never done   INFLUENZA VACCINE  11/13/2022   COVID-19 Vaccine (4 - 2023-24 season) 12/14/2022    Colorectal cancer screening: Type of screening: Colonoscopy. Completed 08/16/20. Repeat every 5 years  Lung Cancer Screening: (Low Dose CT Chest recommended if Age 27-80 years, 20 pack-year currently smoking OR have quit w/in 15years.) does not qualify.    Additional  Screening:  Hepatitis C Screening: does qualify; Completed no  Vision Screening: Recommended annual ophthalmology exams for early detection of glaucoma and other disorders of the eye. Is the patient up to date with their annual eye exam?  No  Who is the provider or what is the name of the office in which the patient attends annual eye exams? No one If pt is not established with a provider, would they like to be referred to a provider to establish care? No .   Dental Screening: Recommended annual dental exams for proper oral hygiene   Community Resource Referral / Chronic Care Management: CRR required this visit?  No   CCM required this visit?  No     Plan:     I have personally reviewed and noted the following in the patient's chart:   Medical and social history Use of alcohol, tobacco or illicit drugs  Current medications and supplements including opioid prescriptions. Patient is not currently taking opioid prescriptions. Functional ability and status Nutritional status Physical activity Advanced directives List of other physicians Hospitalizations, surgeries, and ER visits in previous 12 months Vitals Screenings to include cognitive, depression, and falls Referrals and appointments  In addition, I have reviewed and discussed with  patient certain preventive protocols, quality metrics, and best practice recommendations. A written personalized care plan for preventive services as well as general preventive health recommendations were provided to patient.     Hal Hope, LPN   11/22/9145   After Visit Summary: (MyChart) Due to this being a telephonic visit, the after visit summary with patients personalized plan was offered to patient via MyChart   Nurse Notes: none

## 2023-01-27 ENCOUNTER — Ambulatory Visit: Payer: Medicare PPO

## 2023-01-28 ENCOUNTER — Ambulatory Visit: Payer: Medicare PPO | Admitting: Pulmonary Disease

## 2023-03-02 ENCOUNTER — Ambulatory Visit: Payer: Medicare PPO | Admitting: Physician Assistant

## 2023-03-02 NOTE — Progress Notes (Deleted)
Chad Amy, PA-C [address]  [address]  [place], [place] [address]  Main: [phone]  Fax: [phone]   Primary Care Physician: Chad Limerick, MD  Primary Gastroenterologist:  ***  CC: Follow-up celiac, IDA, dysphagia, esophageal stricture, GERD  HPI: Chad Hudson is a 62 y.o. male returns for 43-month follow-up of celiac disease, iron deficiency anemia, esophageal dysphagia with distal esophageal stricture.  Also has a large hiatal hernia and GERD.  History of alcohol use.  Daily drinker, 2-3 vodka drinks per day.  He [facility] to [facility] ED 12/22/2022 with 2-day history of left flank pain.  Abdominal pelvic CT without contrast showed acute uncomplicated descending colon diverticulitis, large hiatal hernia, fatty liver.  Diverticulitis was treated with Augmentin.  -Labs 12/22/2022: Elevated AST 46, ALT 48.  Normal hemoglobin 13.9 and white count 7.2 low platelets 129.  Normal lipase 38. -Labs 10/30/2022 elevated TTG IgA and antigliadin antibody IgA, consistent with celiac. -Labs 10/02/2022 showed normal hemoglobin 14.8, total iron 44, low iron saturation 9%.  Ferritin 38.  Normal CMP and TSH. -Labs 05/2022 showed mild iron deficiency anemia with hemoglobin 12.7, MCV 78, ferritin 16.  He was started on oral iron.  He cannot tolerate oral iron which causes GI upset.   Abdominal pelvic CT with contrast 10/16/2022 showed large hiatal hernia, hepatic steatosis, otherwise unrevealing.  Mild diverticulosis.  No explanation for weight loss.   EGD 08/2020 (to evaluate IDA) showed medium hiatal hernia, 1 benign appearing mild stenosis at the GE junction dilated to 18 mm.  Normal stomach and duodenum.  Duodenal biopsies showed mild nonspecific chronic duodenitis with villous blunting.  Negative for intraepithelial lymphocytes.  There was concern for possible Celiac.  Lab 08/2020 (celiac panel) showed slightly elevated tissue transglutaminase AB IgA of 6.  Endometrial antibody negative.    Colonoscopy 08/2020 showed excellent prep, grade 1 nonbleeding internal hemorrhoids, and sigmoid diverticulosis.  No polyps.   5-year repeat colonoscopy (due 08/2025).  Family history colon cancer maternal grandfather and grandmother.   Capsule endoscopy 09/2020 was normal.   RUQ abdominal ultrasound 09/2021 hepatic steatosis, otherwise normal.     Chest CT without contrast 09/2021 showed 2 mm right upper lobe lung nodule, likely benign.  Linear bandlike scarring at the right lung apex with resolution of previous cavitary lesion.  Moderate to large hiatal hernia with mild wall thickening of the distal esophagus, consistent with reflux esophagitis.  Hepatic steatosis with splenomegaly.  Current Outpatient Medications  Medication Sig Dispense Refill   albuterol (VENTOLIN HFA) 108 (90 Base) MCG/ACT inhaler Inhale 2 puffs into the lungs every 6 (six) hours as needed for wheezing or shortness of breath. (Patient not taking: Reported on 01/06/2023) 8 g 1   amLODipine (NORVASC) 10 MG tablet TAKE 1 TABLET(10 MG) BY MOUTH DAILY 90 tablet 0   atorvastatin (LIPITOR) 20 MG tablet Take 1 tablet (20 mg total) by mouth daily. Take 1 tablet by mouth daily. 90 tablet 1   ferrous sulfate 325 (65 FE) MG tablet Take 1 tablet (325 mg total) by mouth daily. 30 tablet 2   hydrALAZINE (APRESOLINE) 10 MG tablet TAKE 1 TABLET(10 MG) BY MOUTH THREE TIMES DAILY 270 tablet 0   losartan (COZAAR) 100 MG tablet TAKE 1 TABLET(100 MG) BY MOUTH DAILY 90 tablet 1   omeprazole (PRILOSEC) 40 MG capsule TAKE 1 CAPSULE(40 MG) BY MOUTH EVERY MORNING 90 capsule 0   oxyCODONE (ROXICODONE) 5 MG immediate release tablet Take 1 tablet (5 mg total) by  mouth every 8 (eight) hours as needed for up to 12 doses for breakthrough pain or severe pain. (Patient not taking: Reported on 01/06/2023) 12 tablet 0   sertraline (ZOLOFT) 100 MG tablet TAKE 2 TABLETS(200 MG) BY MOUTH DAILY 90 tablet 1   No current facility-administered medications for this visit.     Allergies as of 03/02/2023   (No Known Allergies)    Past Medical History:  Diagnosis Date   Alcoholism (HCC)    Anemia    Anxiety    Celiac disease    Depression    Dysphagia    Esophageal stricture    Fatty liver    GERD (gastroesophageal reflux disease)    Hyperlipidemia    Hypertension    Iron deficiency anemia    Large hiatal hernia    Systolic ejection murmur    Weight loss     Past Surgical History:  Procedure Laterality Date   COLONOSCOPY WITH PROPOFOL N/A 08/16/2020   Procedure: COLONOSCOPY WITH PROPOFOL;  Surgeon: Midge Minium, MD;  Location: [facility];  Service: Endoscopy;  Laterality: N/A;   ESOPHAGEAL DILATION  08/16/2020   Procedure: ESOPHAGEAL DILATION;  Surgeon: Midge Minium, MD;  Location: [facility];  Service: Endoscopy;;   ESOPHAGOGASTRODUODENOSCOPY (EGD) WITH PROPOFOL N/A 08/16/2020   Procedure: ESOPHAGOGASTRODUODENOSCOPY (EGD) WITH PROPOFOL;  Surgeon: Midge Minium, MD;  Location: [facility];  Service: Endoscopy;  Laterality: N/A;   GIVENS CAPSULE STUDY N/A 09/18/2020   Procedure: GIVENS CAPSULE STUDY;  Surgeon: Midge Minium, MD;  Location: [facility] ENDOSCOPY;  Service: Endoscopy;  Laterality: N/A;   HEMORRHOID SURGERY     INNER EAR SURGERY     LUMBAR FUSION  2011    Review of Systems:    All systems reviewed and negative except where noted in HPI.   Physical Examination:   There were no vitals taken for this visit.  General: Well-nourished, well-developed in no acute distress.  Lungs: Clear to auscultation bilaterally. Non-labored. Heart: Regular rate and rhythm, no murmurs rubs or gallops.  Abdomen: Bowel sounds are normal; Abdomen is Soft; No hepatosplenomegaly, masses or hernias;  No Abdominal Tenderness; No guarding or rebound tenderness. Neuro: Alert and oriented x 3.  Grossly intact.  Psych: Alert and cooperative, normal mood and affect.   Imaging Studies: No results found.  Assessment and Plan:   Chad Hudson is a 62 y.o. y/o male   1.  Acute uncomplicated descending colon diverticulitis treated 12/2022 with Augmentin.  2.  Celiac disease  Continue gluten-free diet  3.  Large hiatal hernia and GERD  Schedule repeat EGD  Discuss Nissen fundoplication surgery consult  4.  Hepatic steatosis  Continue low-fat diet  Lab: NASH Fibrosure  5.  Moderate alcohol use; thrombocytopenia; transient elevated liver transaminases  Encouraged cessation of all alcohol.  Lab: CBC, CMP, Hepatitis A/B/C labs  6.  History of iron deficiency anemia, resolved  Labs: Iron Panal, Ferritin  7.  History of esophageal stricture  EGD with dilation if having dysphagia  8.  Family history of colon cancer maternal grandparents  Plan for 5-year repeat colonoscopy due 08/2025.    Chad Amy, PA-C  Follow up ***  BP check ***

## 2023-03-15 ENCOUNTER — Other Ambulatory Visit: Payer: Self-pay | Admitting: Family Medicine

## 2023-03-15 DIAGNOSIS — E7801 Familial hypercholesterolemia: Secondary | ICD-10-CM

## 2023-03-15 DIAGNOSIS — K760 Fatty (change of) liver, not elsewhere classified: Secondary | ICD-10-CM

## 2023-03-18 ENCOUNTER — Other Ambulatory Visit: Payer: Self-pay | Admitting: Family Medicine

## 2023-03-18 DIAGNOSIS — I10 Essential (primary) hypertension: Secondary | ICD-10-CM

## 2023-03-20 NOTE — Telephone Encounter (Signed)
Requested Prescriptions  Pending Prescriptions Disp Refills   hydrALAZINE (APRESOLINE) 10 MG tablet [Pharmacy Med Name: HYDRALAZINE 10 MG TABLETS (ORANGE)] 270 tablet     Sig: TAKE 1 TABLET(10 MG) BY MOUTH THREE TIMES DAILY     Cardiovascular:  Vasodilators Failed - 03/18/2023  9:37 AM      Failed - PLT in normal range and within 360 days    Platelets  Date Value Ref Range Status  12/22/2022 129 (L) 150 - 400 K/uL Final  10/02/2022 151 150 - 450 x10E3/uL Final         Failed - ANA Screen, Ifa, Serum in normal range and within 360 days    No results found for: "ANA", "ANATITER", "LABANTI"       Failed - Last BP in normal range    BP Readings from Last 1 Encounters:  12/22/22 (!) 181/103         Passed - HCT in normal range and within 360 days    HCT  Date Value Ref Range Status  12/22/2022 42.6 39.0 - 52.0 % Final   Hematocrit  Date Value Ref Range Status  10/02/2022 46.3 37.5 - 51.0 % Final         Passed - HGB in normal range and within 360 days    Hemoglobin  Date Value Ref Range Status  12/22/2022 13.9 13.0 - 17.0 g/dL Final  78/29/5621 30.8 13.0 - 17.7 g/dL Final         Passed - RBC in normal range and within 360 days    RBC  Date Value Ref Range Status  12/22/2022 4.59 4.22 - 5.81 MIL/uL Final         Passed - WBC in normal range and within 360 days    WBC  Date Value Ref Range Status  12/22/2022 7.2 4.0 - 10.5 K/uL Final         Passed - Valid encounter within last 12 months    Recent Outpatient Visits           6 months ago Essential hypertension   [place] [facility]   11 months ago Iron deficiency anemia due to chronic blood loss   [facility]   1 year ago Essential hypertension   [place] [facility]   1 year ago Essential hypertension   [place]  [facility]   1 year ago DOE (dyspnea on exertion)    [facility]       Future Appointments             In 6 days Duanne Limerick, MD [facility]             amLODipine (NORVASC) 10 MG tablet [Pharmacy Med Name: AMLODIPINE BESYLATE 10MG  TABLETS] 90 tablet 0    Sig: TAKE 1 TABLET(10 MG) BY MOUTH DAILY     Cardiovascular: Calcium Channel Blockers 2 Failed - 03/18/2023  9:37 AM      Failed - Last BP in normal range    BP Readings from Last 1 Encounters:  12/22/22 (!) 181/103         Passed - Last  Heart Rate in normal range    Pulse Readings from Last 1 Encounters:  12/22/22 84         Passed - Valid encounter within last 6 months    Recent Outpatient Visits           6 months ago Essential hypertension   [place] [facility]   11 months ago Iron deficiency anemia due to chronic blood loss   [facility]   1 year ago Essential hypertension   [place] [facility]   1 year ago Essential hypertension   [place] [facility]   1 year ago DOE (dyspnea on exertion)   [facility]       Future Appointments             In 6 days Duanne Limerick, MD [facility] at [facility], [facility]

## 2023-03-20 NOTE — Telephone Encounter (Signed)
Requested medication (s) are due for refill today: yes  Requested medication (s) are on the active medication list: yes  Last refill:  12/18/22 #270  Future visit scheduled: yes  Notes to clinic:  abnormal lab work   Requested Prescriptions  Pending Prescriptions Disp Refills   hydrALAZINE (APRESOLINE) 10 MG tablet [Pharmacy Med Name: HYDRALAZINE 10 MG TABLETS (ORANGE)] 270 tablet     Sig: TAKE 1 TABLET(10 MG) BY MOUTH THREE TIMES DAILY     Cardiovascular:  Vasodilators Failed - 03/18/2023  9:37 AM      Failed - PLT in normal range and within 360 days    Platelets  Date Value Ref Range Status  12/22/2022 129 (L) 150 - 400 K/uL Final  10/02/2022 151 150 - 450 x10E3/uL Final         Failed - ANA Screen, Ifa, Serum in normal range and within 360 days    No results found for: "ANA", "ANATITER", "LABANTI"       Failed - Last BP in normal range    BP Readings from Last 1 Encounters:  12/22/22 (!) 181/103         Passed - HCT in normal range and within 360 days    HCT  Date Value Ref Range Status  12/22/2022 42.6 39.0 - 52.0 % Final   Hematocrit  Date Value Ref Range Status  10/02/2022 46.3 37.5 - 51.0 % Final         Passed - HGB in normal range and within 360 days    Hemoglobin  Date Value Ref Range Status  12/22/2022 13.9 13.0 - 17.0 g/dL Final  78/29/5621 30.8 13.0 - 17.7 g/dL Final         Passed - RBC in normal range and within 360 days    RBC  Date Value Ref Range Status  12/22/2022 4.59 4.22 - 5.81 MIL/uL Final         Passed - WBC in normal range and within 360 days    WBC  Date Value Ref Range Status  12/22/2022 7.2 4.0 - 10.5 K/uL Final         Passed - Valid encounter within last 12 months    Recent Outpatient Visits           6 months ago Essential hypertension   [place] [facility]   11 months ago Iron deficiency anemia due to chronic blood loss   [facility] at [facility]   1 year ago Essential hypertension   [place] [facility]   1 year ago Essential hypertension   [place] [facility]   1 year ago DOE (dyspnea on exertion)   [place] [facility]       Future Appointments             In 6 days Duanne Limerick, MD [facility] at [facility], [facility]            Signed Prescriptions Disp Refills   amLODipine (NORVASC) 10 MG tablet 90 tablet 0    Sig: TAKE 1 TABLET(10 MG) BY MOUTH DAILY     Cardiovascular: Calcium Channel Blockers 2 Failed - 03/18/2023  9:37 AM      Failed - Last BP in normal range    BP Readings from Last 1 Encounters:  12/22/22 (!) 181/103         Passed - Last Heart Rate in normal range    Pulse Readings from Last 1 Encounters:  12/22/22 84         Passed - Valid encounter within last 6 months    Recent Outpatient Visits           6 months ago Essential hypertension   [place] [facility]   11 months ago Iron deficiency anemia due to chronic blood loss   [facility]   1 year ago Essential hypertension   [place] [facility]   1 year ago Essential hypertension   [place] [facility]   1 year ago DOE (dyspnea on exertion)   [facility]       Future Appointments             In 6 days Duanne Limerick, MD [facility] at [facility], [facility]

## 2023-03-26 ENCOUNTER — Ambulatory Visit: Payer: Medicare PPO | Admitting: Family Medicine

## 2023-03-31 ENCOUNTER — Ambulatory Visit: Payer: Medicare PPO | Admitting: Family Medicine

## 2023-03-31 ENCOUNTER — Other Ambulatory Visit
Admission: RE | Admit: 2023-03-31 | Discharge: 2023-03-31 | Disposition: A | Discharge status: Home / Self Care | Payer: Medicare PPO | Attending: Family Medicine | Admitting: Family Medicine

## 2023-03-31 ENCOUNTER — Encounter: Payer: Self-pay | Admitting: Family Medicine

## 2023-03-31 VITALS — BP 134/76 | HR 76 | Ht 70.0 in | Wt 194.0 lb

## 2023-03-31 DIAGNOSIS — K219 Gastro-esophageal reflux disease without esophagitis: Secondary | ICD-10-CM

## 2023-03-31 DIAGNOSIS — K760 Fatty (change of) liver, not elsewhere classified: Secondary | ICD-10-CM | POA: Diagnosis not present

## 2023-03-31 DIAGNOSIS — E7801 Familial hypercholesterolemia: Secondary | ICD-10-CM | POA: Insufficient documentation

## 2023-03-31 DIAGNOSIS — F419 Anxiety disorder, unspecified: Secondary | ICD-10-CM | POA: Diagnosis not present

## 2023-03-31 DIAGNOSIS — I10 Essential (primary) hypertension: Secondary | ICD-10-CM | POA: Insufficient documentation

## 2023-03-31 DIAGNOSIS — Z23 Encounter for immunization: Secondary | ICD-10-CM | POA: Diagnosis not present

## 2023-03-31 DIAGNOSIS — F32A Depression, unspecified: Secondary | ICD-10-CM | POA: Diagnosis not present

## 2023-03-31 LAB — COMPREHENSIVE METABOLIC PANEL
ALT: 73 U/L — ABNORMAL HIGH (ref 0–44)
AST: 120 U/L — ABNORMAL HIGH (ref 15–41)
Albumin: 4.4 g/dL (ref 3.5–5.0)
Alkaline Phosphatase: 55 U/L (ref 38–126)
Anion gap: 8 (ref 5–15)
BUN: 14 mg/dL (ref 6–20)
CO2: 26 mmol/L (ref 22–32)
Calcium: 9.2 mg/dL (ref 8.9–10.3)
Chloride: 105 mmol/L (ref 98–111)
Creatinine, Ser: 0.78 mg/dL (ref 0.61–1.24)
GFR, Estimated: >60 mL/min (ref >60)
Glucose, Bld: 92 mg/dL (ref 70–99)
Potassium: 4.1 mmol/L (ref 3.5–5.1)
Sodium: 139 mmol/L (ref 135–145)
Total Bilirubin: 0.6 mg/dL (ref <1.2)
Total Protein: 7.8 g/dL (ref 6.5–8.1)

## 2023-03-31 LAB — LIPID PANEL
Cholesterol: 183 mg/dL (ref 0–200)
HDL: 111 mg/dL (ref >40)
LDL Cholesterol: 61 mg/dL (ref 0–99)
Total CHOL/HDL Ratio: 1.6 {ratio}
Triglycerides: 55 mg/dL (ref <150)
VLDL: 11 mg/dL (ref 0–40)

## 2023-03-31 MED ORDER — SERTRALINE HCL 100 MG PO TABS: ORAL_TABLET | ORAL | 1 refills | Status: DC

## 2023-03-31 MED ORDER — AMLODIPINE BESYLATE 10 MG PO TABS
ORAL_TABLET | ORAL | 1 refills | Status: DC
Start: 2023-03-31 — End: 2023-07-02

## 2023-03-31 MED ORDER — LOSARTAN POTASSIUM 100 MG PO TABS: ORAL_TABLET | ORAL | 1 refills | Status: DC

## 2023-03-31 MED ORDER — OMEPRAZOLE 40 MG PO CPDR: DELAYED_RELEASE_CAPSULE | ORAL | 1 refills | Status: DC

## 2023-03-31 MED ORDER — ATORVASTATIN CALCIUM 20 MG PO TABS
20.0000 mg | ORAL_TABLET | Freq: Every day | ORAL | 1 refills | Status: DC
Start: 2023-03-31 — End: 2023-09-10

## 2023-03-31 NOTE — Patient Instructions (Signed)

## 2023-03-31 NOTE — Progress Notes (Signed)
Date:  03/31/2023   Name:  Chad Hudson   DOB:  07-Mar-1961   MRN:  [id number]   Chief Complaint: Hypertension, Depression, Gastroesophageal Reflux, and Hyperlipidemia  Hypertension This is a chronic problem. The current episode started more than 1 year ago. The problem has been gradually improving since onset. The problem is controlled. Pertinent negatives include no anxiety, blurred vision, chest pain, headaches, malaise/fatigue, neck pain, orthopnea, palpitations, peripheral edema, PND, shortness of breath or sweats. There are no associated agents to hypertension. There are no known risk factors for coronary artery disease. Past treatments include angiotensin blockers and calcium channel blockers. The current treatment provides moderate improvement. There are no compliance problems.  There is no history of chronic renal disease or a hypertension causing med.  Depression        This is a chronic problem.  The current episode started more than 1 year ago.   The onset quality is gradual.   The problem occurs intermittently.  The problem has been gradually improving since onset.  Associated symptoms include no decreased concentration, no fatigue, no helplessness, no hopelessness, does not have insomnia, not irritable, no restlessness, no decreased interest, no appetite change, no body aches, no myalgias, no headaches, no indigestion, not sad and no suicidal ideas.  Past treatments include SSRIs - Selective serotonin reuptake inhibitors.  Compliance with treatment is good.  Previous treatment provided moderate relief.   Pertinent negatives include no anxiety. Gastroesophageal Reflux He reports no abdominal pain, no belching, no chest pain, no choking, no coughing, no dysphagia, no early satiety, no hoarse voice, no nausea, no sore throat, no stridor, no tooth decay, no water brash or no wheezing. This is a chronic problem. The current episode started more than 1 year ago. The problem occurs  frequently. The problem has been gradually improving. The symptoms are aggravated by certain foods. Pertinent negatives include no fatigue. He has tried a PPI for the symptoms. The treatment provided moderate relief. Past procedures do not include an abdominal ultrasound.  Hyperlipidemia This is a chronic problem. The current episode started more than 1 year ago. He has no history of chronic renal disease. There are no known factors aggravating his hyperlipidemia. Pertinent negatives include no chest pain, myalgias or shortness of breath. Current antihyperlipidemic treatment includes diet change and statins. There are no compliance problems.     Lab Results  Component Value Date   NA 139 12/22/2022   K 3.6 12/22/2022   CO2 24 12/22/2022   GLUCOSE 117 (H) 12/22/2022   BUN 14 12/22/2022   CREATININE 0.66 12/22/2022   CALCIUM 9.8 12/22/2022   EGFR 99 10/02/2022   GFRNONAA >60 12/22/2022   Lab Results  Component Value Date   CHOL 197 09/16/2022   HDL 109 09/16/2022   LDLCALC 68 09/16/2022   TRIG 101 09/16/2022   CHOLHDL 1.8 09/16/2022   Lab Results  Component Value Date   TSH 1.410 10/02/2022   No results found for: "HGBA1C" Lab Results  Component Value Date   WBC 7.2 12/22/2022   HGB 13.9 12/22/2022   HCT 42.6 12/22/2022   MCV 92.8 12/22/2022   PLT 129 (L) 12/22/2022   Lab Results  Component Value Date   ALT 48 (H) 12/22/2022   AST 46 (H) 12/22/2022   ALKPHOS 73 12/22/2022   BILITOT 1.1 12/22/2022   Lab Results  Component Value Date   VD25OH 15.0 (L) 10/30/2022     Review of Systems  Constitutional:  Negative for appetite change, chills, fatigue, fever and malaise/fatigue.  HENT:  Negative for drooling, ear discharge, ear pain, hoarse voice and sore throat.   Eyes:  Negative for blurred vision.  Respiratory:  Negative for cough, choking, shortness of breath and wheezing.   Cardiovascular:  Negative for chest pain, palpitations, orthopnea, leg swelling and PND.   Gastrointestinal:  Negative for abdominal pain, blood in stool, constipation, diarrhea, dysphagia and nausea.  Endocrine: Negative for polydipsia.  Genitourinary:  Negative for dysuria, frequency, hematuria and urgency.  Musculoskeletal:  Negative for back pain, myalgias and neck pain.  Skin:  Negative for rash.  Allergic/Immunologic: Negative for environmental allergies.  Neurological:  Negative for dizziness and headaches.  Hematological:  Does not bruise/bleed easily.  Psychiatric/Behavioral:  Positive for depression. Negative for decreased concentration and suicidal ideas. The patient is not nervous/anxious and does not have insomnia.     Patient Active Problem List   Diagnosis Date Noted   Pharyngoesophageal dysphagia 03/29/2021   SOBOE (shortness of breath on exertion) 10/09/2020   Iron deficiency anemia due to chronic blood loss    Stricture and stenosis of esophagus    Class 1 obesity due to excess calories without serious comorbidity with body mass index (BMI) of 30.0 to 30.9 in adult 05/12/2019   Anxiety and depression 05/12/2019   Gastroesophageal reflux disease 05/12/2019   Hyperlipidemia 05/12/2019   Leukopenia 05/12/2019   Vitamin D deficiency 05/12/2019   Essential hypertension 01/07/2018   History of cholesteatoma 06/10/2012   Mixed conductive and sensorineural hearing loss of left ear 06/10/2012    No Known Allergies  Past Surgical History:  Procedure Laterality Date   COLONOSCOPY WITH PROPOFOL N/A 08/16/2020   Procedure: COLONOSCOPY WITH PROPOFOL;  Surgeon: Midge Minium, MD;  [facility];  Service: Endoscopy;  Laterality: N/A;   ESOPHAGEAL DILATION  08/16/2020   Procedure: ESOPHAGEAL DILATION;  Surgeon: Midge Minium, MD;  Location: [facility];  Service: Endoscopy;;   ESOPHAGOGASTRODUODENOSCOPY (EGD) WITH PROPOFOL N/A 08/16/2020   Procedure: ESOPHAGOGASTRODUODENOSCOPY (EGD) WITH PROPOFOL;  Surgeon: Midge Minium, MD;  Location: [facility];  Service: Endoscopy;  Laterality: N/A;   GIVENS CAPSULE STUDY N/A 09/18/2020   Procedure: GIVENS CAPSULE STUDY;  Surgeon: Midge Minium, MD;  Location: [facility] END[facility]COPY;  Service: Endoscopy;  Laterality: N/A;   HEMORRHOID SURGERY     INNER EAR SURGERY     LUMBAR FUSION  2011    Social History   Tobacco Use   Smoking status: Former    Current packs/day: 0.00    Average packs/day: 0.5 packs/day for 20.0 years (10.0 ttl pk-yrs)    Types: Cigarettes    Start date: 04/14/2000    Quit date: 04/14/2020    Years since quitting: 2.9   Smokeless tobacco: Never  Vaping Use   Vaping status: Never Used  Substance Use Topics   Alcohol use: Yes    Alcohol/week: 6.0 - 10.0 standard drinks of alcohol    Types: 6 - 10 Standard drinks or equivalent per week    Comment: 3-7 airplane bottles vodka approx 3 days per week 12/26/20   Drug use: Yes    Types: Marijuana     Medication list has been reviewed and updated.  Current Meds  Medication Sig   amLODipine (NORVASC) 10 MG tablet TAKE 1 TABLET(10 MG) BY MOUTH DAILY   atorvastatin (LIPITOR) 20 MG tablet TAKE 1 TABLET(20 MG) BY MOUTH DAILY   ferrous sulfate 325 (65 FE) MG tablet Take 1 tablet (  325 mg total) by mouth daily.   hydrALAZINE (APRESOLINE) 10 MG tablet TAKE 1 TABLET(10 MG) BY MOUTH THREE TIMES DAILY   losartan (COZAAR) 100 MG tablet TAKE 1 TABLET(100 MG) BY MOUTH DAILY   omeprazole (PRILOSEC) 40 MG capsule TAKE 1 CAPSULE(40 MG) BY MOUTH EVERY MORNING   sertraline (ZOLOFT) 100 MG tablet TAKE 2 TABLETS(200 MG) BY MOUTH DAILY       03/31/2023   10:35 AM 09/16/2022    9:21 AM 03/27/2022    1:47 PM 03/20/2022   10:51 AM  GAD 7 : Generalized Anxiety Score  Nervous, Anxious, on Edge 0 0 0 0  Control/stop worrying 0 0 0 0  Worry too much - different things 0 0 0 0  Trouble relaxing 0 0 0 0  Restless 0 0 0 0  Easily annoyed or irritable 0 0 0 0  Afraid - awful might happen 0 0 0 0  Total GAD 7 Score 0 0 0 0  Anxiety Difficulty  Not difficult at all Not difficult at all Not difficult at all Not difficult at all       03/31/2023   10:35 AM 01/06/2023   11:31 AM 09/16/2022    9:20 AM  Depression screen PHQ 2/9  Decreased Interest 0 0 0  Down, Depressed, Hopeless 0 0 0  PHQ - 2 Score 0 0 0  Altered sleeping 0 0 0  Tired, decreased energy 0 0 0  Change in appetite 0 0 0  Feeling bad or failure about yourself  0 0 0  Trouble concentrating 0 0 0  Moving slowly or fidgety/restless 0 0 0  Suicidal thoughts 0 0 0  PHQ-9 Score 0 0 0  Difficult doing work/chores Not difficult at all Not difficult at all Not difficult at all    BP Readings from Last 3 Encounters:  03/31/23 134/76  12/22/22 (!) 181/103  12/10/22 128/82    Physical Exam Vitals and nursing note reviewed.  Constitutional:      General: He is not irritable. HENT:     Head: Normocephalic.     Right Ear: External ear normal.     Left Ear: External ear normal.     Nose: Nose normal.  Eyes:     General: No scleral icterus.       Right eye: No discharge.        Left eye: No discharge.     Conjunctiva/sclera: Conjunctivae normal.     Pupils: Pupils are equal, round, and reactive to light.  Neck:     Thyroid: No thyromegaly.     Vascular: No JVD.     Trachea: No tracheal deviation.  Cardiovascular:     Rate and Rhythm: Normal rate and regular rhythm.     Heart sounds: Normal heart sounds. No murmur heard.    No friction rub. No gallop.  Pulmonary:     Effort: No respiratory distress.     Breath sounds: Normal breath sounds. No wheezing or rales.  Abdominal:     General: Bowel sounds are normal.     Palpations: Abdomen is soft. There is no mass.     Tenderness: There is no abdominal tenderness. There is no guarding or rebound.  Musculoskeletal:        General: No tenderness. Normal range of motion.     Cervical back: Normal range of motion and neck supple.  Lymphadenopathy:     Cervical: No cervical adenopathy.  Skin:    General: Skin is  warm.     Findings: No rash.  Neurological:     Mental Status: He is alert and oriented to person, place, and time.     Cranial Nerves: No cranial nerve deficit.     Deep Tendon Reflexes: Reflexes are normal and symmetric.     Wt Readings from Last 3 Encounters:  03/31/23 194 lb (88 kg)  12/22/22 187 lb 13.3 oz (85.2 kg)  12/10/22 187 lb 12.8 oz (85.2 kg)    BP 134/76   Pulse 76   Ht 5\' 10"  (1.778 m)   Wt 194 lb (88 kg)   SpO2 99%   BMI 27.84 kg/m   Assessment and Plan: 1. Essential hypertension (Primary) Chronic.  Controlled.  Stable.  Blood pressure 134/76.  Asymptomatic.  Tolerating medication well.  Continue amlodipine 10 mg once a day and losartan 100 mg once a day.  Will check CMP for electrolytes and GFR.  Will recheck patient in 6 months. - amLODipine (NORVASC) 10 MG tablet; TAKE 1 TABLET(10 MG) BY MOUTH DAILY  Dispense: 90 tablet; Refill: 1 - losartan (COZAAR) 100 MG tablet; TAKE 1 TABLET(100 MG) BY MOUTH DAILY  Dispense: 90 tablet; Refill: 1 - Comprehensive metabolic panel  2. Steatosis of liver Chronic.  Controlled.  Stay stable.  Patient has noted to have some increased cholesterol of the liver and we are treating this with atorvastatin 20 mg once a day.  Will repeat CMP for liver function test-transaminases.  Patient is also been given low-cholesterol low triglyceride dietary guidelines - atorvastatin (LIPITOR) 20 MG tablet; Take 1 tablet (20 mg total) by mouth daily.  Dispense: 90 tablet; Refill: 1  3. Familial hypercholesterolemia Chronic.  Controlled.  Stable.  Asymptomatic.  Tolerating statin well without myalgias or without muscle weakness.  Continue atorvastatin 20 mg once a day.  Will check lipid panel for cholesterol and triglycerides concerns. - atorvastatin (LIPITOR) 20 MG tablet; Take 1 tablet (20 mg total) by mouth daily.  Dispense: 90 tablet; Refill: 1 - Lipid Panel With LDL/HDL Ratio  4. Gastroesophageal reflux disease without esophagitis Chronic.   Controlled.  Stable.  Asymptomatic.  Tolerating a omeprazole 40 mg once a day.  Will continue with current dosing. - omeprazole (PRILOSEC) 40 MG capsule; TAKE 1 CAPSULE(40 MG) BY MOUTH EVERY MORNING  Dispense: 90 capsule; Refill: 1  5. Anxiety and depression .  Controlled.  Stable.  PHQ is 0 GAD score is 0 continue sertraline 100 mg daily.  Will recheck and 6 months. - sertraline (ZOLOFT) 100 MG tablet; TAKE 2 TABLETS(200 MG) BY MOUTH DAILY  Dispense: 90 tablet; Refill: 1  6. Need for immunization against influenza Discussed and administered - Flu vaccine trivalent PF, 6mos and older(Flulaval,Afluria,Fluarix,Fluzone)     Elizabeth Sauer, MD

## 2023-04-01 ENCOUNTER — Encounter: Payer: Self-pay | Admitting: Family Medicine

## 2023-06-04 ENCOUNTER — Emergency Department
Admission: EM | Admit: 2023-06-04 | Discharge: 2023-06-04 | Disposition: A | Discharge status: Home / Self Care | Payer: Medicare PPO | Attending: Emergency Medicine | Admitting: Emergency Medicine

## 2023-06-04 ENCOUNTER — Other Ambulatory Visit: Payer: Self-pay

## 2023-06-04 ENCOUNTER — Emergency Department: Payer: Medicare PPO

## 2023-06-04 DIAGNOSIS — S82832A Other fracture of upper and lower end of left fibula, initial encounter for closed fracture: Secondary | ICD-10-CM | POA: Diagnosis not present

## 2023-06-04 DIAGNOSIS — W19XXXA Unspecified fall, initial encounter: Secondary | ICD-10-CM | POA: Diagnosis not present

## 2023-06-04 DIAGNOSIS — M7989 Other specified soft tissue disorders: Secondary | ICD-10-CM | POA: Diagnosis not present

## 2023-06-04 DIAGNOSIS — S52592A Other fractures of lower end of left radius, initial encounter for closed fracture: Secondary | ICD-10-CM | POA: Diagnosis not present

## 2023-06-04 DIAGNOSIS — S82432A Displaced oblique fracture of shaft of left fibula, initial encounter for closed fracture: Secondary | ICD-10-CM | POA: Diagnosis not present

## 2023-06-04 DIAGNOSIS — S99912A Unspecified injury of left ankle, initial encounter: Secondary | ICD-10-CM | POA: Diagnosis present

## 2023-06-04 MED ORDER — OXYCODONE HCL 5 MG PO TABS
5.0000 mg | ORAL_TABLET | Freq: Once | ORAL | Status: AC
  Administered 2023-06-04: 5 mg via ORAL
  Filled 2023-06-04: qty 1

## 2023-06-04 MED ORDER — KETOROLAC TROMETHAMINE 30 MG/ML IJ SOLN
30.0000 mg | Freq: Once | INTRAMUSCULAR | Status: AC
Start: 2023-06-04 — End: 2023-06-04
  Administered 2023-06-04: 30 mg via INTRAMUSCULAR
  Filled 2023-06-04: qty 1

## 2023-06-04 MED ORDER — OXYCODONE HCL 5 MG PO TABS: 5.0000 mg | ORAL_TABLET | Freq: Three times a day (TID) | ORAL | 0 refills | Status: DC | PRN

## 2023-06-04 NOTE — Discharge Instructions (Addendum)
 As we discussed, you broke your ankle/fibula on the left side.  Leave your foot and ankle in the boot at all times, even while sleeping.  You can briefly take it off to bathe or change clothes, but be careful not to put weight on your left foot.  Follow-up with orthopedic doctor, have also attached the information for a podiatrist you can call for follow-up.   Please take Tylenol and ibuprofen/Advil for your pain.  It is safe to take them together, or to alternate them every few hours.  Take up to 1000mg  of Tylenol at a time, up to 4 times per day.  Do not take more than 4000 mg of Tylenol in 24 hours.  For ibuprofen, take 400-600 mg, 3 - 4 times per day.  Oxycodone as needed for more severe/breakthrough pain or to help with sleep.  Ice and elevation

## 2023-06-04 NOTE — ED Triage Notes (Signed)
 Pt to ED for fall yesterday, slipped on ice. Denies hitting head or LOC. Reports left ankle pain.

## 2023-06-04 NOTE — ED Provider Notes (Signed)
 [facility] Provider Note    Event Date/Time   First MD Initiated Contact with Patient 06/04/23 [phone]     (approximate)   History   Fall   HPI  Chad Hudson is a 63 y.o. male who presents to the ED for evaluation of Fall   Patient presents to the ED for evaluation of left ankle pain after he slipped and fell last night on the ice.  Mechanical fall without other injuries or syncope.  No preceding illnesses.   Physical Exam   Triage Vital Signs: ED Triage Vitals [06/04/23 0909]  Encounter Vitals Group     BP (!) 163/102     Systolic BP Percentile      Diastolic BP Percentile      Pulse Rate 74     Resp 18     Temp 98 F (36.7 C)     Temp src      SpO2 97 %     Weight 195 lb (88.5 kg)     Height 5\' 10"  (1.778 m)     Head Circumference      Peak Flow      Pain Score 7     Pain Loc      Pain Education      Exclude from Growth Chart     Most recent vital signs: Vitals:   06/04/23 0909  BP: (!) 163/102  Pulse: 74  Resp: 18  Temp: 98 F (36.7 C)  SpO2: 97%    General: Awake, no distress.  CV:  Good peripheral perfusion.  Resp:  Normal effort.  Abd:  No distention.  MSK:   Soft tissue swelling around the left ankle without evidence of open injury.  Distally neurovascularly intact.  Neuro:  No focal deficits appreciated. Other:     ED Results / Procedures / Treatments   Labs (all labs ordered are listed, but only abnormal results are displayed) Labs Reviewed - No data to display  EKG   RADIOLOGY Plain film of the left ankle interpreted by me with a left distal fibular fracture  Official radiology report(s): DG Ankle Complete Left Result Date: 06/04/2023 CLINICAL DATA:  Left ankle pain after fall. EXAM: LEFT ANKLE COMPLETE - 3+ VIEW COMPARISON:  None Available. FINDINGS: Mildly displaced oblique fracture is seen involving the distal left fibula. Overlying soft tissue swelling is noted laterally. Tibia is unremarkable.  IMPRESSION: Mildly displaced distal left fibular fracture. Electronically Signed   By: Lupita Raider M.D.   On: 06/04/2023 09:52    PROCEDURES and INTERVENTIONS:  Procedures  Medications  ketorolac (TORADOL) 30 MG/ML injection 30 mg (has no administration in time range)  oxyCODONE (Oxy IR/ROXICODONE) immediate release tablet 5 mg (has no administration in time range)     IMPRESSION / MDM / ASSESSMENT AND PLAN / ED COURSE  I reviewed the triage vital signs and the nursing notes.  Differential diagnosis includes, but is not limited to, trimalleolar or bimalleolar fracture, fibular fracture, ankle sprain, syncope  {Patient presents with symptoms of an acute illness or injury that is potentially life-threatening.  Patient presents about 12 hours after a mechanical fall with a fibular fracture requiring immobilization and suitable for outpatient management with orthopedic or podiatric follow-up.  No other injuries.  X-rays, as above.  He has an orthopedist that he can see and I also give him podiatric follow-up information.  Provide boot, crutches for immobilization and nonweightbearing status.      FINAL CLINICAL  IMPRESSION(S) / ED DIAGNOSES   Final diagnoses:  Other closed fracture of distal end of left fibula, initial encounter  Fall, initial encounter     Rx / DC Orders   ED Discharge Orders          Ordered    oxyCODONE (ROXICODONE) 5 MG immediate release tablet  Every 8 hours PRN        06/04/23 1008             Note:  This document was prepared using Dragon voice recognition software and may include unintentional dictation errors.   Delton Prairie, MD 06/04/23 [phone]

## 2023-06-11 DIAGNOSIS — M25572 Pain in left ankle and joints of left foot: Secondary | ICD-10-CM | POA: Diagnosis not present

## 2023-06-11 DIAGNOSIS — S82302A Unspecified fracture of lower end of left tibia, initial encounter for closed fracture: Secondary | ICD-10-CM | POA: Diagnosis not present

## 2023-06-11 DIAGNOSIS — S82832A Other fracture of upper and lower end of left fibula, initial encounter for closed fracture: Secondary | ICD-10-CM | POA: Diagnosis not present

## 2023-07-02 ENCOUNTER — Other Ambulatory Visit: Payer: Self-pay | Admitting: Family Medicine

## 2023-07-02 DIAGNOSIS — I10 Essential (primary) hypertension: Secondary | ICD-10-CM

## 2023-09-10 ENCOUNTER — Encounter: Payer: Self-pay | Admitting: Family Medicine

## 2023-09-10 ENCOUNTER — Ambulatory Visit (INDEPENDENT_AMBULATORY_CARE_PROVIDER_SITE_OTHER): Payer: Self-pay | Admitting: Family Medicine

## 2023-09-10 ENCOUNTER — Other Ambulatory Visit
Admission: RE | Admit: 2023-09-10 | Discharge: 2023-09-10 | Disposition: A | Discharge status: Home / Self Care | Attending: Family Medicine | Admitting: Family Medicine

## 2023-09-10 VITALS — BP 150/118 | HR 110 | Ht 70.0 in | Wt 186.0 lb

## 2023-09-10 DIAGNOSIS — F32A Depression, unspecified: Secondary | ICD-10-CM

## 2023-09-10 DIAGNOSIS — F419 Anxiety disorder, unspecified: Secondary | ICD-10-CM

## 2023-09-10 DIAGNOSIS — I10 Essential (primary) hypertension: Secondary | ICD-10-CM | POA: Insufficient documentation

## 2023-09-10 DIAGNOSIS — K760 Fatty (change of) liver, not elsewhere classified: Secondary | ICD-10-CM

## 2023-09-10 DIAGNOSIS — D500 Iron deficiency anemia secondary to blood loss (chronic): Secondary | ICD-10-CM | POA: Diagnosis not present

## 2023-09-10 DIAGNOSIS — E7801 Familial hypercholesterolemia: Secondary | ICD-10-CM | POA: Diagnosis not present

## 2023-09-10 DIAGNOSIS — K219 Gastro-esophageal reflux disease without esophagitis: Secondary | ICD-10-CM

## 2023-09-10 DIAGNOSIS — Z91199 Patient's noncompliance with other medical treatment and regimen due to unspecified reason: Secondary | ICD-10-CM

## 2023-09-10 LAB — CBC WITH DIFFERENTIAL/PLATELET
Abs Immature Granulocytes: 0.01 10*3/uL (ref 0.00–0.07)
Basophils Absolute: 0 10*3/uL (ref 0.0–0.1)
Basophils Relative: 1 %
Eosinophils Absolute: 0.1 10*3/uL (ref 0.0–0.5)
Eosinophils Relative: 2 %
HCT: 38 % — ABNORMAL LOW (ref 39.0–52.0)
Hemoglobin: 12.1 g/dL — ABNORMAL LOW (ref 13.0–17.0)
Immature Granulocytes: 0 %
Lymphocytes Relative: 18 %
Lymphs Abs: 1 10*3/uL (ref 0.7–4.0)
MCH: 26.4 pg (ref 26.0–34.0)
MCHC: 31.8 g/dL (ref 30.0–36.0)
MCV: 83 fL (ref 80.0–100.0)
Monocytes Absolute: 0.4 10*3/uL (ref 0.1–1.0)
Monocytes Relative: 8 %
Neutro Abs: 4.1 10*3/uL (ref 1.7–7.7)
Neutrophils Relative %: 71 %
Platelets: 165 10*3/uL (ref 150–400)
RBC: 4.58 MIL/uL (ref 4.22–5.81)
RDW: 15.4 % (ref 11.5–15.5)
WBC: 5.7 10*3/uL (ref 4.0–10.5)
nRBC: 0 % (ref 0.0–0.2)

## 2023-09-10 LAB — COMPREHENSIVE METABOLIC PANEL WITH GFR
ALT: 41 U/L (ref 0–44)
AST: 41 U/L (ref 15–41)
Albumin: 5.1 g/dL — ABNORMAL HIGH (ref 3.5–5.0)
Alkaline Phosphatase: 63 U/L (ref 38–126)
Anion gap: 15 (ref 5–15)
BUN: 9 mg/dL (ref 6–20)
CO2: 27 mmol/L (ref 22–32)
Calcium: 9.5 mg/dL (ref 8.9–10.3)
Chloride: 96 mmol/L — ABNORMAL LOW (ref 98–111)
Creatinine, Ser: 0.85 mg/dL (ref 0.61–1.24)
GFR, Estimated: >60 mL/min (ref >60)
Glucose, Bld: 109 mg/dL — ABNORMAL HIGH (ref 70–99)
Potassium: 3.1 mmol/L — ABNORMAL LOW (ref 3.5–5.1)
Sodium: 138 mmol/L (ref 135–145)
Total Bilirubin: 1.2 mg/dL (ref 0.0–1.2)
Total Protein: 9 g/dL — ABNORMAL HIGH (ref 6.5–8.1)

## 2023-09-10 LAB — LIPID PANEL
Cholesterol: 232 mg/dL — ABNORMAL HIGH (ref 0–200)
HDL: >135 mg/dL (ref >40)
Triglycerides: 87 mg/dL (ref <150)
VLDL: 17 mg/dL (ref 0–40)

## 2023-09-10 MED ORDER — AMLODIPINE BESYLATE 10 MG PO TABS: ORAL_TABLET | ORAL | 1 refills | Status: AC

## 2023-09-10 MED ORDER — SERTRALINE HCL 100 MG PO TABS: ORAL_TABLET | ORAL | 1 refills | Status: DC

## 2023-09-10 MED ORDER — ATORVASTATIN CALCIUM 20 MG PO TABS: 20.0000 mg | ORAL_TABLET | Freq: Every day | ORAL | 1 refills | Status: AC

## 2023-09-10 MED ORDER — SPIRONOLACTONE 25 MG PO TABS
25.0000 mg | ORAL_TABLET | Freq: Every day | ORAL | 3 refills | Status: DC
Start: 2023-09-10 — End: 2024-02-04

## 2023-09-10 MED ORDER — HYDRALAZINE HCL 10 MG PO TABS: ORAL_TABLET | ORAL | 0 refills | Status: DC

## 2023-09-10 MED ORDER — LOSARTAN POTASSIUM 100 MG PO TABS: ORAL_TABLET | ORAL | 1 refills | Status: AC

## 2023-09-10 MED ORDER — OMEPRAZOLE 40 MG PO CPDR: DELAYED_RELEASE_CAPSULE | ORAL | 1 refills | Status: DC

## 2023-09-10 MED ORDER — SERTRALINE HCL 100 MG PO TABS: ORAL_TABLET | ORAL | 1 refills | Status: AC

## 2023-09-10 NOTE — Progress Notes (Signed)
 Date:  09/10/2023   Name:  Chad Hudson   DOB:  12/11/1960   MRN:  [id number]   Chief Complaint: Hypertension  Hypertension This is a chronic problem. The current episode started more than 1 year ago. The problem has been gradually worsening since onset. The problem is uncontrolled. Associated symptoms include shortness of breath. Pertinent negatives include no anxiety, blurred vision, chest pain, headaches, malaise/fatigue, neck pain, orthopnea, palpitations, peripheral edema, PND or sweats. (DOE) There are no associated agents to hypertension. There are no known risk factors for coronary artery disease. Past treatments include angiotensin blockers, direct vasodilators and calcium  channel blockers. The current treatment provides mild improvement. There is no history of CAD/MI or CVA. There is no history of chronic renal disease, a hypertension causing med or renovascular disease.    Lab Results  Component Value Date   NA 139 03/31/2023   K 4.1 03/31/2023   CO2 26 03/31/2023   GLUCOSE 92 03/31/2023   BUN 14 03/31/2023   CREATININE 0.78 03/31/2023   CALCIUM  9.2 03/31/2023   EGFR 99 10/02/2022   GFRNONAA >60 03/31/2023   Lab Results  Component Value Date   CHOL 183 03/31/2023   HDL 111 03/31/2023   LDLCALC 61 03/31/2023   TRIG 55 03/31/2023   CHOLHDL 1.6 03/31/2023   Lab Results  Component Value Date   TSH 1.410 10/02/2022   No results found for: "HGBA1C" Lab Results  Component Value Date   WBC 7.2 12/22/2022   HGB 13.9 12/22/2022   HCT 42.6 12/22/2022   MCV 92.8 12/22/2022   PLT 129 (L) 12/22/2022   Lab Results  Component Value Date   ALT 73 (H) 03/31/2023   AST 120 (H) 03/31/2023   ALKPHOS 55 03/31/2023   BILITOT 0.6 03/31/2023   Lab Results  Component Value Date   VD25OH 15.0 (L) 10/30/2022     Review of Systems  Constitutional:  Negative for malaise/fatigue.  Eyes:  Negative for blurred vision and visual disturbance.  Respiratory:  Positive for  shortness of breath. Negative for cough, chest tightness, wheezing and stridor.   Cardiovascular:  Negative for chest pain, palpitations, orthopnea and PND.  Musculoskeletal:  Negative for neck pain.  Neurological:  Negative for headaches.    Patient Active Problem List   Diagnosis Date Noted   Pharyngoesophageal dysphagia 03/29/2021   SOBOE (shortness of breath on exertion) 10/09/2020   Iron deficiency anemia due to chronic blood loss    Stricture and stenosis of esophagus    Class 1 obesity due to excess calories without serious comorbidity with body mass index (BMI) of 30.0 to 30.9 in adult 05/12/2019   Anxiety and depression 05/12/2019   Gastroesophageal reflux disease 05/12/2019   Hyperlipidemia 05/12/2019   Leukopenia 05/12/2019   Vitamin D  deficiency 05/12/2019   Essential hypertension 01/07/2018   History of cholesteatoma 06/10/2012   Mixed conductive and sensorineural hearing loss of left ear 06/10/2012    No Known Allergies  Past Surgical History:  Procedure Laterality Date   COLONOSCOPY WITH PROPOFOL  N/A 08/16/2020   Procedure: COLONOSCOPY WITH PROPOFOL ;  Surgeon: Marnee Sink, MD;  Location: [facility];  Service: Endoscopy;  Laterality: N/A;   ESOPHAGEAL DILATION  08/16/2020   Procedure: ESOPHAGEAL DILATION;  Surgeon: Marnee Sink, MD;  Location: [facility];  Service: Endoscopy;;   ESOPHAGOGASTRODUODENOSCOPY (EGD) WITH PROPOFOL  N/A 08/16/2020   Procedure: ESOPHAGOGASTRODUODENOSCOPY (EGD) WITH PROPOFOL ;  Surgeon: Marnee Sink, MD;  Location: [facility];  Service: Endoscopy;  Laterality: N/A;   GIVENS CAPSULE STUDY N/A 09/18/2020   Procedure: GIVENS CAPSULE STUDY;  Surgeon: Marnee Sink, MD;  Location: [facility] END[facility]COPY;  Service: Endoscopy;  Laterality: N/A;   HEMORRHOID SURGERY     INNER EAR SURGERY     LUMBAR FUSION  2011    Social History   Tobacco Use   Smoking status: Former    Current packs/day: 0.00    Average packs/day: 0.5 packs/day for  20.0 years (10.0 ttl pk-yrs)    Types: Cigarettes    Start date: 04/14/2000    Quit date: 04/14/2020    Years since quitting: 3.4   Smokeless tobacco: Never  Vaping Use   Vaping status: Never Used  Substance Use Topics   Alcohol use: Yes    Alcohol/week: 6.0 - 10.0 standard drinks of alcohol    Types: 6 - 10 Standard drinks or equivalent per week    Comment: 3-7 airplane bottles vodka approx 3 days per week 12/26/20   Drug use: Yes    Types: Marijuana     Medication list has been reviewed and updated.  Current Meds  Medication Sig   amLODipine  (NORVASC ) 10 MG tablet TAKE 1 TABLET(10 MG) BY MOUTH DAILY   atorvastatin  (LIPITOR) 20 MG tablet Take 1 tablet (20 mg total) by mouth daily.   hydrALAZINE  (APRESOLINE ) 10 MG tablet TAKE 1 TABLET(10 MG) BY MOUTH THREE TIMES DAILY   losartan  (COZAAR ) 100 MG tablet TAKE 1 TABLET(100 MG) BY MOUTH DAILY   omeprazole  (PRILOSEC) 40 MG capsule TAKE 1 CAPSULE(40 MG) BY MOUTH EVERY MORNING   sertraline  (ZOLOFT ) 100 MG tablet TAKE 2 TABLETS(200 MG) BY MOUTH DAILY   spironolactone (ALDACTONE) 25 MG tablet Take 1 tablet (25 mg total) by mouth daily.       09/10/2023    1:19 PM 03/31/2023   10:35 AM 09/16/2022    9:21 AM 03/27/2022    1:47 PM  GAD 7 : Generalized Anxiety Score  Nervous, Anxious, on Edge 0 0 0 0  Control/stop worrying 0 0 0 0  Worry too much - different things 0 0 0 0  Trouble relaxing 0 0 0 0  Restless 0 0 0 0  Easily annoyed or irritable 0 0 0 0  Afraid - awful might happen 1 0 0 0  Total GAD 7 Score 1 0 0 0  Anxiety Difficulty Not difficult at all Not difficult at all Not difficult at all Not difficult at all       09/10/2023    1:18 PM 03/31/2023   10:35 AM 01/06/2023   11:31 AM  Depression screen PHQ 2/9  Decreased Interest 0 0 0  Down, Depressed, Hopeless 0 0 0  PHQ - 2 Score 0 0 0  Altered sleeping 0 0 0  Tired, decreased energy 1 0 0  Change in appetite 0 0 0  Feeling bad or failure about yourself  0 0 0  Trouble  concentrating 0 0 0  Moving slowly or fidgety/restless 0 0 0  Suicidal thoughts 0 0 0  PHQ-9 Score 1 0 0  Difficult doing work/chores Not difficult at all Not difficult at all Not difficult at all    BP Readings from Last 3 Encounters:  09/10/23 (!) 150/118  06/04/23 (!) 163/102  03/31/23 134/76    Physical Exam Vitals and nursing note reviewed.  Constitutional:      Interventions: He is not intubated. HENT:     Right Ear: Tympanic membrane normal.  Left Ear: Tympanic membrane normal.     Mouth/Throat:     Mouth: Mucous membranes are moist.  Cardiovascular:     Rate and Rhythm: Normal rate.     Heart sounds: S1 normal and S2 normal. No murmur heard.    No systolic murmur is present.     No diastolic murmur is present.     No gallop. No S3 or S4 sounds.  Pulmonary:     Effort: He is not intubated.     Breath sounds: Normal breath sounds. No decreased air movement. No decreased breath sounds, wheezing, rhonchi or rales.  Abdominal:     Tenderness: There is no abdominal tenderness. There is no right CVA tenderness or guarding.  Musculoskeletal:     Right lower leg: No edema.     Left lower leg: No edema.     Wt Readings from Last 3 Encounters:  09/10/23 186 lb (84.4 kg)  06/04/23 195 lb (88.5 kg)  03/31/23 194 lb (88 kg)    BP (!) 150/118   Pulse (!) 110   Ht 5\' 10"  (1.778 m)   Wt 186 lb (84.4 kg)   SpO2 98%   BMI 26.69 kg/m   Assessment and Plan: 1. Essential hypertension (Primary) Chronic.  Uncontrolled.  Stable.  Continue amlodipine  10 mg once a day hydralazine  10 mg 3 times a day losartan  100 mg once a day will check CMP for electrolytes and GFR.  We will since add spironolactone 25 mg once a day and we will have patient return in 4 weeks for recheck. - spironolactone (ALDACTONE) 25 MG tablet; Take 1 tablet (25 mg total) by mouth daily.  Dispense: 90 tablet; Refill: 3 - amLODipine  (NORVASC ) 10 MG tablet; TAKE 1 TABLET(10 MG) BY MOUTH DAILY  Dispense: 90  tablet; Refill: 1 - hydrALAZINE  (APRESOLINE ) 10 MG tablet; TAKE 1 TABLET(10 MG) BY MOUTH THREE TIMES DAILY  Dispense: 270 tablet; Refill: 0 - losartan  (COZAAR ) 100 MG tablet; TAKE 1 TABLET(100 MG) BY MOUTH DAILY  Dispense: 90 tablet; Refill: 1 - Comprehensive metabolic panel with GFR  2. Steatosis of liver Chronic stable.  Asymptomatic.  Patient continues to drink vodka and understands the concerns we will check CMP for current level of transaminases. - atorvastatin  (LIPITOR) 20 MG tablet; Take 1 tablet (20 mg total) by mouth daily.  Dispense: 90 tablet; Refill: 1  3. Familial hypercholesterolemia Chronic.  Controlled.  Stable.  Asymptomatic.  Without myalgias without muscle weakness.  Continue atorvastatin  20 mg once a day.  Will check lipid panel for current level of LDL control.  Will check CMP for hepatic concerns - atorvastatin  (LIPITOR) 20 MG tablet; Take 1 tablet (20 mg total) by mouth daily.  Dispense: 90 tablet; Refill: 1 - Lipid Panel With LDL/HDL Ratio  4. Gastroesophageal reflux disease without esophagitis Chronic.  Controlled.  Stable.  Continue omeprazole  40 mg once a day. - omeprazole  (PRILOSEC) 40 MG capsule; TAKE 1 CAPSULE(40 MG) BY MOUTH EVERY MORNING  Dispense: 90 capsule; Refill: 1  5. Anxiety and depression Chronic.  Controlled.  Stable.  PHQ is 1.  GAD score is 1.  Continue sertraline  100 mg 2 tablets once a day. - sertraline  (ZOLOFT ) 100 MG tablet; TAKE 2 TABLETS(200 MG) BY MOUTH DAILY  Dispense: 90 tablet; Refill: 1  6. Iron deficiency anemia due to chronic blood loss Continue daily dosing.  Apparently patient has not been taking the hydralazine  which may have contributed to his - CBC with Differential/Platelet  7. Noncompliance Apparently  patient has not been taking his hydralazine  which may have contributed to his noncontrol of blood pressure at this time.    Alayne Allis, MD

## 2023-09-11 ENCOUNTER — Ambulatory Visit: Payer: Self-pay | Admitting: Family Medicine

## 2023-09-11 ENCOUNTER — Other Ambulatory Visit: Payer: Self-pay | Admitting: Family Medicine

## 2023-09-11 MED ORDER — POTASSIUM CHLORIDE CRYS ER 10 MEQ PO TBCR: 10.0000 meq | EXTENDED_RELEASE_TABLET | Freq: Two times a day (BID) | ORAL | 0 refills | Status: AC

## 2023-10-08 ENCOUNTER — Encounter: Payer: Self-pay | Admitting: Family Medicine

## 2023-10-08 ENCOUNTER — Ambulatory Visit: Admitting: Family Medicine

## 2024-01-04 ENCOUNTER — Telehealth: Payer: Self-pay

## 2024-01-04 NOTE — Telephone Encounter (Signed)
 Patients needs a TOC appointment.

## 2024-01-14 ENCOUNTER — Ambulatory Visit: Payer: Self-pay | Admitting: Emergency Medicine

## 2024-01-14 VITALS — Ht 70.0 in | Wt 190.0 lb

## 2024-01-14 DIAGNOSIS — Z0000 Encounter for general adult medical examination without abnormal findings: Secondary | ICD-10-CM

## 2024-01-14 NOTE — Progress Notes (Signed)
 Subjective:   Chad Hudson is a 63 y.o. who presents for a Medicare Wellness preventive visit.  As a reminder, Annual Wellness Visits don't include a physical exam, and some assessments may be limited, especially if this visit is performed virtually. We may recommend an in-person follow-up visit with your provider if needed.  Visit Complete: Virtual I connected with  Chad Hudson on 01/14/24 by a audio enabled telemedicine application and verified that I am speaking with the correct person using two identifiers.  Patient Location: Home  Provider Location: Home Office  I discussed the limitations of evaluation and management by telemedicine. The patient expressed understanding and agreed to proceed.  Vital Signs: Because this visit was a virtual/telehealth visit, some criteria may be missing or patient reported. Any vitals not documented were not able to be obtained and vitals that have been documented are patient reported.  VideoDeclined- This patient declined Librarian, academic. Therefore the visit was completed with audio only.  Persons Participating in Visit: Patient.  AWV Questionnaire: No: Patient Medicare AWV questionnaire was not completed prior to this visit.  Cardiac Risk Factors include: advanced age (>36men, >43 women);male gender;dyslipidemia;hypertension     Objective:    Today's Vitals   01/14/24 1049 01/14/24 1050  Weight: 190 lb (86.2 kg)   Height: 5' 10 (1.778 m)   PainSc:  3    Body mass index is 27.26 kg/m.     01/14/2024   11:03 AM 06/04/2023    9:10 AM 01/06/2023   11:32 AM 12/22/2022    8:09 AM 03/20/2022    6:21 PM 12/31/2021   11:14 AM 12/26/2020    1:41 PM  Advanced Directives  Does Patient Have a Medical Advance Directive? No No No No No No No  Would patient like information on creating a medical advance directive? Yes (MAU/Ambulatory/Procedural Areas - Information given)  No - Patient declined No - Patient  declined  No - Patient declined Yes (MAU/Ambulatory/Procedural Areas - Information given)    Current Medications (verified) Outpatient Encounter Medications as of 01/14/2024  Medication Sig   amLODipine  (NORVASC ) 10 MG tablet TAKE 1 TABLET(10 MG) BY MOUTH DAILY   atorvastatin  (LIPITOR) 20 MG tablet Take 1 tablet (20 mg total) by mouth daily.   hydrALAZINE  (APRESOLINE ) 10 MG tablet TAKE 1 TABLET(10 MG) BY MOUTH THREE TIMES DAILY   losartan  (COZAAR ) 100 MG tablet TAKE 1 TABLET(100 MG) BY MOUTH DAILY   omeprazole  (PRILOSEC) 40 MG capsule TAKE 1 CAPSULE(40 MG) BY MOUTH EVERY MORNING   potassium chloride  (KLOR-CON  M) 10 MEQ tablet Take 1 tablet (10 mEq total) by mouth 2 (two) times daily.   sertraline  (ZOLOFT ) 100 MG tablet TAKE 2 TABLETS(200 MG) BY MOUTH DAILY   ferrous sulfate  325 (65 FE) MG tablet Take 1 tablet (325 mg total) by mouth daily. (Patient not taking: Reported on 01/14/2024)   oxyCODONE  (ROXICODONE ) 5 MG immediate release tablet Take 1 tablet (5 mg total) by mouth every 8 (eight) hours as needed. (Patient not taking: Reported on 01/14/2024)   spironolactone  (ALDACTONE ) 25 MG tablet Take 1 tablet (25 mg total) by mouth daily. (Patient not taking: Reported on 01/14/2024)   No facility-administered encounter medications on file as of 01/14/2024.    Allergies (verified) Patient has no known allergies.   History: Past Medical History:  Diagnosis Date   Alcoholism (HCC)    Anemia    Anxiety    Celiac disease    Depression    Dysphagia  Esophageal stricture    Fatty liver    GERD (gastroesophageal reflux disease)    Hyperlipidemia    Hypertension    Iron deficiency anemia    Large hiatal hernia    Systolic ejection murmur    Weight loss    Past Surgical History:  Procedure Laterality Date   COLONOSCOPY WITH PROPOFOL  N/A 08/16/2020   Procedure: COLONOSCOPY WITH PROPOFOL ;  Surgeon: Jinny Carmine, MD;  Location: [facility];  Service: Endoscopy;  Laterality: N/A;    ESOPHAGEAL DILATION  08/16/2020   Procedure: ESOPHAGEAL DILATION;  Surgeon: Jinny Carmine, MD;  Location: [facility];  Service: Endoscopy;;   ESOPHAGOGASTRODUODENOSCOPY (EGD) WITH PROPOFOL  N/A 08/16/2020   Procedure: ESOPHAGOGASTRODUODENOSCOPY (EGD) WITH PROPOFOL ;  Surgeon: Jinny Carmine, MD;  Location: [facility];  Service: Endoscopy;  Laterality: N/A;   GIVENS CAPSULE STUDY N/A 09/18/2020   Procedure: GIVENS CAPSULE STUDY;  Surgeon: Jinny Carmine, MD;  Location: [facility];  Service: Endoscopy;  Laterality: N/A;   HEMORRHOID SURGERY     INNER EAR SURGERY     LUMBAR FUSION  2011   Family History  Problem Relation Age of Onset   Hypertension Mother    Healthy Father    Cancer Maternal Grandfather    Social History   Socioeconomic History   Marital status: Divorced    Spouse name: Not on file   Number of children: 2   Years of education: Not on file   Highest education level: Not on file  Occupational History   Occupation: Disabled  Tobacco Use   Smoking status: Former    Current packs/day: 0.00    Average packs/day: 0.5 packs/day for 20.0 years (10.0 ttl pk-yrs)    Types: Cigarettes    Start date: 04/14/2000    Quit date: 04/14/2020    Years since quitting: 3.7   Smokeless tobacco: Never  Vaping Use   Vaping status: Never Used  Substance and Sexual Activity   Alcohol use: Yes    Alcohol/week: 12.0 standard drinks of alcohol    Types: 12 Standard drinks or equivalent per week    Comment: 4 airplane bottles vodka approx 3 days per week 01/14/24   Drug use: Not Currently    Types: Marijuana    Comment: last use July 2025   Sexual activity: Not Currently  Other Topics Concern   Not on file  Social History Narrative   Pt lives with his mother and helps take care of her   Social Drivers of Health   Financial Resource Strain: Low Risk  (01/14/2024)   Overall Financial Resource Strain (CARDIA)    Difficulty of Paying Living Expenses: Not hard at all  Food  Insecurity: No Food Insecurity (01/14/2024)   Hunger Vital Sign    Worried About Running Out of Food in the Last Year: Never true    Ran Out of Food in the Last Year: Never true  Transportation Needs: No Transportation Needs (01/14/2024)   PRAPARE - Administrator, Civil Service (Medical): No    Lack of Transportation (Non-Medical): No  Physical Activity: Insufficiently Active (01/14/2024)   Exercise Vital Sign    Days of Exercise per Week: 4 days    Minutes of Exercise per Session: 20 min  Stress: No Stress Concern Present (01/14/2024)   [employer] of Occupational Health - Occupational Stress Questionnaire    Feeling of Stress: Not at all  Social Connections: Socially Isolated (01/14/2024)   Social Connection and Isolation Panel  Frequency of Communication with Friends and Family: More than three times a week    Frequency of Social Gatherings with Friends and Family: More than three times a week    Attends Religious Services: Never    Database administrator or Organizations: No    Attends Engineer, structural: Never    Marital Status: Divorced    Tobacco Counseling Counseling given: Not Answered    Clinical Intake:  Pre-visit preparation completed: Yes  Pain : 0-10 Pain Score: 3  Pain Type: Chronic pain Pain Location: Back Pain Descriptors / Indicators: Aching     BMI - recorded: 27.26 Nutritional Status: BMI 25 -29 Overweight Nutritional Risks: None Diabetes: No  No results found for: HGBA1C   How often do you need to have someone help you when you read instructions, pamphlets, or other written materials from your doctor or pharmacy?: 1 - Never  Interpreter Needed?: No  Information entered by :: Vina Ned, CMA   Activities of Daily Living     01/14/2024   10:51 AM  In your present state of health, do you have any difficulty performing the following activities:  Hearing? 1  Comment small amount of hearing loss  Vision? 0   Difficulty concentrating or making decisions? 0  Walking or climbing stairs? 0  Dressing or bathing? 0  Doing errands, shopping? 0  Preparing Food and eating ? N  Using the Toilet? N  In the past six months, have you accidently leaked urine? N  Do you have problems with loss of bowel control? N  Managing your Medications? N  Managing your Finances? N  Housekeeping or managing your Housekeeping? N    Patient Care Team: Kotturi, Vinay K, MD as PCP - General (Family Medicine) Jinny Carmine, MD as Consulting Physician (Gastroenterology) Verlinda Boas, PA-C (Orthopedic Surgery)  I have updated your Care Teams any recent Medical Services you may have received from other providers in the past year.     Assessment:   This is a routine wellness examination for [employer].  Hearing/Vision screen Hearing Screening - Comments:: Denies hearing loss  Vision Screening - Comments:: Needs routine eye exam. Patient declined list of eye doctors to be included in AVs   Goals Addressed             This Visit's Progress    Patient Stated       Decrease drinking       Depression Screen     01/14/2024   11:01 AM 09/10/2023    1:18 PM 03/31/2023   10:35 AM 01/06/2023   11:31 AM 09/16/2022    9:20 AM 03/27/2022    1:47 PM 03/20/2022   10:51 AM  PHQ 2/9 Scores  PHQ - 2 Score 0 0 0 0 0 0 0  PHQ- 9 Score 0 1 0 0 0 0 0    Fall Risk     01/14/2024   11:04 AM 09/10/2023    1:18 PM 03/31/2023   10:35 AM 01/06/2023   11:33 AM 09/16/2022    9:20 AM  Fall Risk   Falls in the past year? 0 1 0 0 0  Number falls in past yr: 0 0 0 0 0  Injury with Fall? 0  0 0 0  Risk for fall due to : No Fall Risks No Fall Risks No Fall Risks No Fall Risks No Fall Risks  Follow up Falls evaluation completed Falls evaluation completed Falls evaluation completed Falls prevention discussed;Falls evaluation  completed Falls evaluation completed    MEDICARE RISK AT HOME:  Medicare Risk at Home Any stairs in or around the  home?: Yes If so, are there any without handrails?: No Home free of loose throw rugs in walkways, pet beds, electrical cords, etc?: Yes Adequate lighting in your home to reduce risk of falls?: Yes Life alert?: No Use of a cane, walker or w/c?: No Grab bars in the bathroom?: No Shower chair or bench in shower?: Yes Elevated toilet seat or a handicapped toilet?: No  TIMED UP AND GO:  Was the test performed?  No  Cognitive Function: 6CIT completed        01/14/2024   11:05 AM 01/06/2023   11:35 AM 01/03/2022    1:28 PM  6CIT Screen  What Year? 0 points 0 points 0 points  What month? 0 points 0 points 0 points  What time? 0 points 0 points 0 points  Count back from 20 0 points 0 points 0 points  Months in reverse 2 points 0 points 0 points  Repeat phrase 0 points 2 points 0 points  Total Score 2 points 2 points 0 points    Immunizations Immunization History  Administered Date(s) Administered   Influenza, Seasonal, Injecte, Preservative Fre 03/31/2023   Influenza,inj,Quad PF,6+ Mos 06/10/2018, 03/20/2022   Influenza-Unspecified 02/17/2019, 03/12/2020, 02/24/2021   Janssen (J&J) SARS-COV-2 Vaccination 07/17/2019   Moderna Sars-Covid-2 Vaccination 03/12/2020, 02/24/2021   Tdap 05/12/2019    Screening Tests Health Maintenance  Topic Date Due   HIV Screening  Never done   Hepatitis C Screening  Never done   Pneumococcal Vaccine: 50+ Years (1 of 2 - PCV) Never done   Zoster Vaccines- Shingrix (1 of 2) Never done   Influenza Vaccine  11/13/2023   COVID-19 Vaccine (4 - 2025-26 season) 12/14/2023   Medicare Annual Wellness (AWV)  01/13/2025   Colonoscopy  08/16/2025   DTaP/Tdap/Td (2 - Td or Tdap) 05/11/2029   Hepatitis B Vaccines 19-59 Average Risk  Aged Out   HPV VACCINES  Aged Out   Meningococcal B Vaccine  Aged Out    Health Maintenance Items Addressed: Vaccines Due: flu and Shingrix, See Nurse Notes at the end of this note  Additional Screening:  Vision  Screening: Recommended annual ophthalmology exams for early detection of glaucoma and other disorders of the eye. Is the patient up to date with their annual eye exam?  No  Who is the provider or what is the name of the office in which the patient attends annual eye exams? Patient will call to schedule. Declined list of eye doctors to be included in AVS  Dental Screening: Recommended annual dental exams for proper oral hygiene  Community Resource Referral / Chronic Care Management: CRR required this visit?  No   CCM required this visit?  No   Plan:    I have personally reviewed and noted the following in the patient's chart:   Medical and social history Use of alcohol, tobacco or illicit drugs  Current medications and supplements including opioid prescriptions. Patient is not currently taking opioid prescriptions. Functional ability and status Nutritional status Physical activity Advanced directives List of other physicians Hospitalizations, surgeries, and ER visits in previous 12 months Vitals Screenings to include cognitive, depression, and falls Referrals and appointments  In addition, I have reviewed and discussed with patient certain preventive protocols, quality metrics, and best practice recommendations. A written personalized care plan for preventive services as well as general preventive health recommendations were  provided to patient.   Vina Ned, CMA   01/14/2024   After Visit Summary: (Mail) Due to this being a telephonic visit, the after visit summary with patients personalized plan was offered to patient via mail   Notes:  Scheduled TOC appointment (prev [facility] pt) for 01/21/24 6 CIT Score - 2 Needs flu vaccine at [facility] on 01/21/24 Needs Shingrix vaccine (pharmacy) Declined Covid vaccine at this time

## 2024-01-14 NOTE — Patient Instructions (Signed)
 Chad Hudson,  Thank you for taking the time for your Medicare Wellness Visit. I appreciate your continued commitment to your health goals. Please review the care plan we discussed, and feel free to reach out if I can assist you further.  Medicare recommends these wellness visits once per year to help you and your care team stay ahead of potential health issues. These visits are designed to focus on prevention, allowing your provider to concentrate on managing your acute and chronic conditions during your regular appointments.  Please note that Annual Wellness Visits do not include a physical exam. Some assessments may be limited, especially if the visit was conducted virtually. If needed, we may recommend a separate in-person follow-up with your provider.  Ongoing Care Seeing your primary care provider every 3 to 6 months helps us  monitor your health and provide consistent, personalized care. I have scheduled you an appointment with Dr. Mackey Ny for 01/21/24. He will be your new PCP.  Referrals If a referral was made during today's visit and you haven't received any updates within two weeks, please contact the referred provider directly to check on the status.  Recommended Screenings:  Call and schedule a routine eye exam at your earliest convenience. We recommend an eye exam every 1-2 years. Get the flu vaccine at your appointment on 01/21/24. Get the Shingles vaccines at your local pharmacy.  Health Maintenance  Topic Date Due   HIV Screening  Never done   Hepatitis C Screening  Never done   Pneumococcal Vaccine for age over 26 (1 of 2 - PCV) Never done   Zoster (Shingles) Vaccine (1 of 2) Never done   Flu Shot  11/13/2023   COVID-19 Vaccine (4 - 2025-26 season) 12/14/2023   Medicare Annual Wellness Visit  01/13/2025   Colon Cancer Screening  08/16/2025   DTaP/Tdap/Td vaccine (2 - Td or Tdap) 05/11/2029   Hepatitis B Vaccine  Aged Out   HPV Vaccine  Aged Out   Meningitis B  Vaccine  Aged Out       01/14/2024   11:03 AM  Advanced Directives  Does Patient Have a Medical Advance Directive? No  Would patient like information on creating a medical advance directive? Yes (MAU/Ambulatory/Procedural Areas - Information given)   Advance Care Planning is important because it: Ensures you receive medical care that aligns with your values, goals, and preferences. Provides guidance to your family and loved ones, reducing the emotional burden of decision-making during critical moments.  Vision: Annual vision screenings are recommended for early detection of glaucoma, cataracts, and diabetic retinopathy. These exams can also reveal signs of chronic conditions such as diabetes and high blood pressure.  Dental: Annual dental screenings help detect early signs of oral cancer, gum disease, and other conditions linked to overall health, including heart disease and diabetes.  Please see the attached documents for additional preventive care recommendations.   Fall Prevention in the Home, Adult Falls can cause injuries and affect people of all ages. There are many simple things that you can do to make your home safe and to help prevent falls. If you need it, ask for help making these changes. What actions can I take to prevent falls? General information Use good lighting in all rooms. Make sure to: Replace any light bulbs that burn out. Turn on lights if it is dark and use night-lights. Keep items that you use often in easy-to-reach places. Lower the shelves around your home if needed. Move furniture so that there  are clear paths around it. Do not keep throw rugs or other things on the floor that can make you trip. If any of your floors are uneven, fix them. Add color or contrast paint or tape to clearly [place] and help you see: Grab bars or handrails. First and last steps of staircases. Where the edge of each step is. If you use a ladder or stepladder: Make sure that it is  fully opened. Do not climb a closed ladder. Make sure the sides of the ladder are locked in place. Have someone hold the ladder while you use it. Know where your pets are as you move through your home. What can I do in the bathroom?     Keep the floor dry. Clean up any water  that is on the floor right away. Remove soap buildup in the bathtub or shower. Buildup makes bathtubs and showers slippery. Use non-skid mats or decals on the floor of the bathtub or shower. Attach bath mats securely with double-sided, non-slip rug tape. If you need to sit down while you are in the shower, use a non-slip stool. Install grab bars by the toilet and in the bathtub and shower. Do not use towel bars as grab bars. What can I do in the bedroom? Make sure that you have a light by your bed that is easy to reach. Do not use any sheets or blankets on your bed that hang to the floor. Have a firm bench or chair with side arms that you can use for support when you get dressed. What can I do in the kitchen? Clean up any spills right away. If you need to reach something above you, use a sturdy step stool that has a grab bar. Keep electrical cables out of the way. Do not use floor polish or wax that makes floors slippery. What can I do with my stairs? Do not leave anything on the stairs. Make sure that you have a light switch at the top and the bottom of the stairs. Have them installed if you do not have them. Make sure that there are handrails on both sides of the stairs. Fix handrails that are broken or loose. Make sure that handrails are as long as the staircases. Install non-slip stair treads on all stairs in your home if they do not have carpet. Avoid having throw rugs at the top or bottom of stairs, or secure the rugs with carpet tape to prevent them from moving. Choose a carpet design that does not hide the edge of steps on the stairs. Make sure that carpet is firmly attached to the stairs. Fix any carpet that  is loose or worn. What can I do on the outside of my home? Use bright outdoor lighting. Repair the edges of walkways and driveways and fix any cracks. Clear paths of anything that can make you trip, such as tools or rocks. Add color or contrast paint or tape to clearly Micah and help you see high doorway thresholds. Trim any bushes or trees on the main path into your home. Check that handrails are securely fastened and in good repair. Both sides of all steps should have handrails. Install guardrails along the edges of any raised decks or porches. Have leaves, snow, and ice cleared regularly. Use sand, salt, or ice melt on walkways during winter months if you live where there is ice and snow. In the garage, clean up any spills right away, including grease or oil spills. What other actions can  I take? Review your medicines with your health care provider. Some medicines can make you confused or feel dizzy. This can increase your chance of falling. Wear closed-toe shoes that fit well and support your feet. Wear shoes that have rubber soles and low heels. Use a cane, walker, scooter, or crutches that help you move around if needed. Talk with your provider about other ways that you can decrease your risk of falls. This may include seeing a physical therapist to learn to do exercises to improve movement and strength. Where to find more information Centers for Disease Control and Prevention, STEADI: TonerPromos.no [employer] on Aging: BaseRingTones.pl National Institute on Aging: BaseRingTones.pl Contact a health care provider if: You are afraid of falling at home. You feel weak, drowsy, or dizzy at home. You fall at home. Get help right away if you: Lose consciousness or have trouble moving after a fall. Have a fall that causes a head injury. These symptoms may be an emergency. Get help right away. Call 911. Do not wait to see if the symptoms will go away. Do not drive yourself to the hospital. This  information is not intended to replace advice given to you by your health care provider. Make sure you discuss any questions you have with your health care provider. Document Revised: 12/02/2021 Document Reviewed: 12/02/2021 Elsevier Patient Education  2024 [employer].

## 2024-01-21 ENCOUNTER — Encounter: Payer: Self-pay | Admitting: Family Medicine

## 2024-01-21 ENCOUNTER — Ambulatory Visit: Admitting: Family Medicine

## 2024-01-21 ENCOUNTER — Other Ambulatory Visit
Admission: RE | Admit: 2024-01-21 | Discharge: 2024-01-21 | Disposition: A | Discharge status: Home / Self Care | Attending: Family Medicine | Admitting: Family Medicine

## 2024-01-21 VITALS — BP 168/102 | HR 86 | Ht 70.0 in | Wt 192.5 lb

## 2024-01-21 DIAGNOSIS — K219 Gastro-esophageal reflux disease without esophagitis: Secondary | ICD-10-CM

## 2024-01-21 DIAGNOSIS — Z23 Encounter for immunization: Secondary | ICD-10-CM

## 2024-01-21 DIAGNOSIS — F419 Anxiety disorder, unspecified: Secondary | ICD-10-CM

## 2024-01-21 DIAGNOSIS — I10 Essential (primary) hypertension: Secondary | ICD-10-CM

## 2024-01-21 DIAGNOSIS — D500 Iron deficiency anemia secondary to blood loss (chronic): Secondary | ICD-10-CM | POA: Diagnosis not present

## 2024-01-21 DIAGNOSIS — Z125 Encounter for screening for malignant neoplasm of prostate: Secondary | ICD-10-CM

## 2024-01-21 DIAGNOSIS — Z131 Encounter for screening for diabetes mellitus: Secondary | ICD-10-CM

## 2024-01-21 DIAGNOSIS — E782 Mixed hyperlipidemia: Secondary | ICD-10-CM | POA: Diagnosis not present

## 2024-01-21 DIAGNOSIS — R221 Localized swelling, mass and lump, neck: Secondary | ICD-10-CM | POA: Diagnosis not present

## 2024-01-21 DIAGNOSIS — F32A Depression, unspecified: Secondary | ICD-10-CM

## 2024-01-21 LAB — COMPREHENSIVE METABOLIC PANEL WITH GFR
ALT: 88 U/L — ABNORMAL HIGH (ref 0–44)
AST: 110 U/L — ABNORMAL HIGH (ref 15–41)
Albumin: 4.5 g/dL (ref 3.5–5.0)
Alkaline Phosphatase: 68 U/L (ref 38–126)
Anion gap: 13 (ref 5–15)
BUN: 12 mg/dL (ref 8–23)
CO2: 22 mmol/L (ref 22–32)
Calcium: 9.2 mg/dL (ref 8.9–10.3)
Chloride: 104 mmol/L (ref 98–111)
Creatinine, Ser: 0.75 mg/dL (ref 0.61–1.24)
GFR, Estimated: >60 mL/min (ref >60)
Glucose, Bld: 99 mg/dL (ref 70–99)
Potassium: 3.6 mmol/L (ref 3.5–5.1)
Sodium: 139 mmol/L (ref 135–145)
Total Bilirubin: 0.6 mg/dL (ref 0.0–1.2)
Total Protein: 8.4 g/dL — ABNORMAL HIGH (ref 6.5–8.1)

## 2024-01-21 LAB — CBC WITH DIFFERENTIAL/PLATELET
Abs Immature Granulocytes: 0.02 K/uL (ref 0.00–0.07)
Basophils Absolute: 0 K/uL (ref 0.0–0.1)
Basophils Relative: 1 %
Eosinophils Absolute: 0.1 K/uL (ref 0.0–0.5)
Eosinophils Relative: 2 %
HCT: 35 % — ABNORMAL LOW (ref 39.0–52.0)
Hemoglobin: 10.9 g/dL — ABNORMAL LOW (ref 13.0–17.0)
Immature Granulocytes: 0 %
Lymphocytes Relative: 15 %
Lymphs Abs: 0.9 K/uL (ref 0.7–4.0)
MCH: 26 pg (ref 26.0–34.0)
MCHC: 31.1 g/dL (ref 30.0–36.0)
MCV: 83.3 fL (ref 80.0–100.0)
Monocytes Absolute: 0.5 K/uL (ref 0.1–1.0)
Monocytes Relative: 9 %
Neutro Abs: 4.3 K/uL (ref 1.7–7.7)
Neutrophils Relative %: 73 %
Platelets: 195 K/uL (ref 150–400)
RBC: 4.2 MIL/uL — ABNORMAL LOW (ref 4.22–5.81)
RDW: 15.6 % — ABNORMAL HIGH (ref 11.5–15.5)
WBC: 5.9 K/uL (ref 4.0–10.5)
nRBC: 0 % (ref 0.0–0.2)

## 2024-01-21 LAB — PSA: Prostatic Specific Antigen: 0.68 ng/mL (ref 0.00–4.00)

## 2024-01-21 LAB — HEMOGLOBIN A1C
Hgb A1c MFr Bld: 5 % (ref 4.8–5.6)
Mean Plasma Glucose: 96.8 mg/dL
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
## 2024-01-22 ENCOUNTER — Ambulatory Visit: Payer: Self-pay | Admitting: Family Medicine

## 2024-01-22 NOTE — Progress Notes (Signed)
 Labs concerning for Anemia, elevated liver enzymes, other labs overall normal will discuss next steps during next visit 10/23.

## 2024-02-04 ENCOUNTER — Encounter: Payer: Self-pay | Admitting: Family Medicine

## 2024-02-04 ENCOUNTER — Ambulatory Visit (INDEPENDENT_AMBULATORY_CARE_PROVIDER_SITE_OTHER): Admitting: Family Medicine

## 2024-02-04 VITALS — BP 138/86 | HR 82 | Ht 70.0 in | Wt 196.0 lb

## 2024-02-04 DIAGNOSIS — Z23 Encounter for immunization: Secondary | ICD-10-CM | POA: Diagnosis not present

## 2024-02-04 DIAGNOSIS — D500 Iron deficiency anemia secondary to blood loss (chronic): Secondary | ICD-10-CM | POA: Diagnosis not present

## 2024-02-04 DIAGNOSIS — I10 Essential (primary) hypertension: Secondary | ICD-10-CM | POA: Diagnosis not present

## 2024-02-04 DIAGNOSIS — K625 Hemorrhage of anus and rectum: Secondary | ICD-10-CM

## 2024-02-04 DIAGNOSIS — F3341 Major depressive disorder, recurrent, in partial remission: Secondary | ICD-10-CM

## 2024-02-04 DIAGNOSIS — R748 Abnormal levels of other serum enzymes: Secondary | ICD-10-CM

## 2024-02-04 MED ORDER — CHLORTHALIDONE 25 MG PO TABS: 25.0000 mg | ORAL_TABLET | Freq: Every day | ORAL | 1 refills | Status: AC

## 2024-02-04 NOTE — Progress Notes (Signed)
 Established Patient Office Visit  Patient ID: Chad Hudson, male    DOB: 08-21-1960  Age: 63 y.o. MRN: [id number] PCP: Javelle Donigan K, MD  Chief Complaint  Patient presents with   Hypertension    Subjective:     HPI  Discussed the use of AI scribe software for clinical note transcription with the patient, who gave verbal consent to proceed.  History of Present Illness Chad Hudson is a 63 year old male with hypertension who presents for follow-up on blood pressure management and also has pain in left fifth finger.  He has hypertension and is on hydralazine , amlodipine , and losartan . He occasionally misses doses of his blood pressure medications and has experienced fluctuations in blood pressure. He also takes Zoloft , having reduced his dose from 200 mg to 150 mg daily, with plans to further reduce to 100 mg.  He experiences rectal bleeding two to three times a month, with bright red blood. He has a history of hemorrhoids and has undergone multiple colonoscopies due to a family history of colon cancer, with previous findings of polyps but none recently. His hemoglobin level is 10.9 g/dL.  He has pain and burning in his fifth finger for a couple of months, described as burning and stinging, especially when scratched or bent. He suspects a fracture but is unsure of any specific injury. No recent trauma such as punching or twisting. The pain is localized to the tip of the finger on the inner side.  He consumes alcohol occasionally and has been informed of elevated liver enzymes in the past. No gallbladder issues.  A cyst on his neck was removed, and he is concerned about the possibility of recurrence if the capsule was not completely removed.  BP Readings from Last 3 Encounters:  02/04/24 138/86  01/21/24 (!) 168/102  09/10/23 (!) 150/118        Review of Systems  All other systems reviewed and are negative.     Objective:     BP 138/86   Pulse 82   Ht 5' 10  (1.778 m)   Wt 196 lb (88.9 kg)   SpO2 97%   BMI 28.12 kg/m  BP Readings from Last 3 Encounters:  02/04/24 138/86  01/21/24 (!) 168/102  09/10/23 (!) 150/118      Physical Exam Vitals and nursing note reviewed.  Constitutional:      Appearance: Normal appearance.  HENT:     Head: Normocephalic.     Right Ear: External ear normal.     Left Ear: External ear normal.  Eyes:     Conjunctiva/sclera: Conjunctivae normal.  Cardiovascular:     Rate and Rhythm: Normal rate.  Pulmonary:     Effort: Pulmonary effort is normal. No respiratory distress.  Abdominal:     Palpations: Abdomen is soft.  Musculoskeletal:        General: Normal range of motion.  Skin:    General: Skin is warm.  Neurological:     Mental Status: He is alert and oriented to person, place, and time.  Psychiatric:        Mood and Affect: Mood normal.     Physical Exam MUSCULOSKELETAL: Pain on palpation of the fifth finger. Burning sensation in the fifth finger on making a fist. Deviation and curvature difference in the fifth finger.   No results found for any visits on 02/04/24.  Last CBC Lab Results  Component Value Date   WBC 5.9 01/21/2024   HGB 10.9 (L)  01/21/2024   HCT 35.0 (L) 01/21/2024   MCV 83.3 01/21/2024   MCH 26.0 01/21/2024   RDW 15.6 (H) 01/21/2024   PLT 195 01/21/2024   Last metabolic panel Lab Results  Component Value Date   GLUCOSE 99 01/21/2024   NA 139 01/21/2024   K 3.6 01/21/2024   CL 104 01/21/2024   CO2 22 01/21/2024   BUN 12 01/21/2024   CREATININE 0.75 01/21/2024   GFRNONAA >60 01/21/2024   CALCIUM  9.2 01/21/2024   PHOS 3.1 09/16/2022   PROT 8.4 (H) 01/21/2024   ALBUMIN 4.5 01/21/2024   LABGLOB 2.8 10/02/2022   BILITOT 0.6 01/21/2024   ALKPHOS 68 01/21/2024   AST 110 (H) 01/21/2024   ALT 88 (H) 01/21/2024   ANIONGAP 13 01/21/2024      The ASCVD Risk score (Arnett DK, et al., 2019) failed to calculate for the following reasons:   The valid HDL cholesterol  range is 20 to 100 mg/dL    Assessment & Plan:   Problem List Items Addressed This Visit   None   Assessment and Plan Assessment & Plan Essential hypertension Blood pressure improved with amlodipine  and losartan  but fluctuates due to hydralazine . - Discontinue hydralazine  and spironolactone . - Prescribe chlorthalidone. - Continue amlodipine  and losartan . - Advise regular blood pressure monitoring. - Schedule follow-up in four weeks.  Anemia with rectal bleeding Slight anemia likely due to chronic blood loss from hemorrhoids. Colonoscopy showed hemorrhoids, no polyps. - Refer to proctologist. - Advise over-the-counter Solgar Gentle Iron. - Monitor rectal bleeding.  Pain and burning of left fifth finger, possible arthritis Chronic pain and burning in left fifth finger, possibly arthritis or fracture. - Order X-ray of left fifth finger. Pt deferred at this time. - Advise use of buddy tape or splint.  Depression Managing well on reduced Zoloft  dose of 150 mg daily. - Continue Zoloft  at 150 mg daily. - Plan to reduce to 100 mg daily after a few weeks.  Elevated liver enzymes Slightly elevated liver enzymes, possibly due to alcohol consumption. - Advise reducing alcohol consumption. - Recheck liver enzymes in a few months.  General Health Maintenance Discussed vaccinations including shingles and pneumonia. Shingles vaccination recommended due to previous shingles. - Administer shingles vaccine. - Plan to administer pneumonia vaccine at next visit.    No follow-ups on file.    Vinary K Damean Poffenberger, MD [facility] Sports Medicine at [facility]

## 2024-03-03 ENCOUNTER — Ambulatory Visit (INDEPENDENT_AMBULATORY_CARE_PROVIDER_SITE_OTHER): Admitting: Family Medicine

## 2024-03-03 ENCOUNTER — Encounter: Payer: Self-pay | Admitting: Family Medicine

## 2024-03-03 VITALS — BP 120/82 | HR 100 | Ht 70.0 in | Wt 188.0 lb

## 2024-03-03 DIAGNOSIS — I10 Essential (primary) hypertension: Secondary | ICD-10-CM

## 2024-03-03 DIAGNOSIS — M25512 Pain in left shoulder: Secondary | ICD-10-CM

## 2024-03-03 DIAGNOSIS — R748 Abnormal levels of other serum enzymes: Secondary | ICD-10-CM

## 2024-03-03 MED ORDER — PREDNISONE 20 MG PO TABS: 40.0000 mg | ORAL_TABLET | Freq: Every day | ORAL | 0 refills | Status: AC

## 2024-03-03 NOTE — Progress Notes (Signed)
 Established Patient Office Visit  Patient ID: Chad Hudson, male    DOB: [birth year]  Age: 63 y.o. MRN: [id number] PCP: Brailyn Delman K, MD  Chief Complaint  Patient presents with   Hypertension    Subjective:     HPI  Discussed the use of AI scribe software for clinical note transcription with the patient, who gave verbal consent to proceed.  History of Present Illness Chad Hudson is a 63 year old male with hypertension who presents for a follow-up visit due to left shoulder pain.  He has been experiencing left shoulder pain for about one to two weeks, significant enough to prevent him from lying on that side or reaching out. Activities such as lifting, reaching, and pulling exacerbate the pain, making it difficult to perform tasks like getting clothes out of the dryer. He has not had previous shoulder problems and does not recall which clavicle he previously broke.  He has a history of hypertension and is currently taking amlodipine  10 mg, losartan , and hygroton . He feels well with his current blood pressure regimen and notes that his blood pressure has not felt high.  He also has a history of a herniated disc and spinal fusion, which contribute to daily pain. He has been taking Motrin for pain relief, but it has not been effective for his shoulder pain.  He reports elevated liver enzymes and acknowledges regular alcohol consumption. No gallbladder issues and his diabetes screening was negative.      Review of Systems  All other systems reviewed and are negative.     Objective:     Ht 5' 10 (1.778 m)   BMI 28.12 kg/m    Physical Exam Vitals and nursing note reviewed.  Constitutional:      Appearance: Normal appearance.  HENT:     Head: Normocephalic.     Right Ear: External ear normal.     Left Ear: External ear normal.  Eyes:     Conjunctiva/sclera: Conjunctivae normal.  Cardiovascular:     Rate and Rhythm: Normal rate.  Pulmonary:     Effort:  Pulmonary effort is normal. No respiratory distress.  Abdominal:     Palpations: Abdomen is soft.  Musculoskeletal:        General: Normal range of motion.  Skin:    General: Skin is warm.  Neurological:     Mental Status: He is alert and oriented to person, place, and time.  Psychiatric:        Mood and Affect: Mood normal.     Physical Exam     No results found for any visits on 03/03/24.     The ASCVD Risk score (Arnett DK, et al., 2019) failed to calculate for the following reasons:   The valid HDL cholesterol range is 20 to 100 mg/dL    Assessment & Plan:   Problem List Items Addressed This Visit   None   Assessment and Plan Assessment & Plan Essential hypertension Blood pressure controlled with current regimen. Hydralazine  discontinued due to adverse effects. - Continue amlodipine , losartan , and hygroton . - Ensure potassium supplementation due to diuretic use. - Check potassium levels with CMP.  Pain in left shoulder Acute severe pain affecting daily activities, exacerbated by movement and pressure. - Prescribed prednisone  for shoulder pain. - Advised follow-up with orthopedic specialist if pain persists.  Elevated liver enzymes Enzymes elevated likely due to alcohol consumption. No gallbladder issues. - Advised reduction in alcohol consumption. - Will recheck liver enzymes at  next visit.    No follow-ups on file.    Vinary K Amado Andal, MD [facility] Primary Care & Sports Medicine at [facility]

## 2024-03-26 ENCOUNTER — Encounter: Payer: Self-pay | Admitting: Emergency Medicine

## 2024-03-26 ENCOUNTER — Ambulatory Visit
Admission: EM | Admit: 2024-03-26 | Discharge: 2024-03-26 | Disposition: A | Discharge status: Home / Self Care | Attending: Emergency Medicine | Admitting: Emergency Medicine

## 2024-03-26 DIAGNOSIS — H16001 Unspecified corneal ulcer, right eye: Secondary | ICD-10-CM

## 2024-03-26 DIAGNOSIS — Z23 Encounter for immunization: Secondary | ICD-10-CM | POA: Diagnosis not present

## 2024-03-26 DIAGNOSIS — S0501XA Injury of conjunctiva and corneal abrasion without foreign body, right eye, initial encounter: Secondary | ICD-10-CM

## 2024-03-26 MED ORDER — TETANUS-DIPHTH-ACELL PERTUSSIS 5-2-15.5 LF-MCG/0.5 IM SUSP
0.5000 mL | Freq: Once | INTRAMUSCULAR | Status: AC
  Administered 2024-03-26: 0.5 mL via INTRAMUSCULAR

## 2024-03-26 MED ORDER — TETANUS-DIPHTH-ACELL PERTUSSIS 5-2-15.5 LF-MCG/0.5 IM SUSP: 0.5000 mL | Freq: Once | INTRAMUSCULAR | Status: DC

## 2024-03-26 MED ORDER — MOXIFLOXACIN HCL 0.5 % OP SOLN: 1.0000 [drp] | OPHTHALMIC | 0 refills | Status: AC | PRN

## 2024-03-26 NOTE — ED Notes (Signed)
 Pt was discharged by provider without getting Tdap vaccine.Staff reached out to patient an asked him would he return to receive his tdap vaccine. Patient return Tdap Administer.

## 2024-03-26 NOTE — ED Triage Notes (Signed)
 Pt present foreign body in right eye last night, pt state he flushed eyes with water  an still unable to see.

## 2024-03-26 NOTE — Discharge Instructions (Addendum)
 I have spoken to Dr. Mittie with [place] [facility] and he would like you to instill 1 drop of Vigamox  antibiotic eyedrops in your right eye every hour while awake for the remainder of today.  Avoid rubbing your eye is much as possible.  You may use over-the-counter Tylenol  and/or ibuprofen according to the package instructions as needed for pain.  He will see you in his office in [place] tomorrow at noon.  The address is [address] [place], [place].  [address].  He has instructed me to advise you to go to the door towards the rear on the side marked after hours insurance.  He will need to there at noon.

## 2024-03-26 NOTE — ED Provider Notes (Addendum)
 [facility]    CSN: [id number] Arrival date & time: 03/26/24  1125      History   Chief Complaint Chief Complaint  Patient presents with   Foreign Body in Eye    HPI Chad Hudson is a 63 y.o. male.   HPI  64 year old male with past medical history significant for anxiety, depression, essential hypertension, GERD, high cholesterol, and IDA presents for evaluation of foreign body sensation in the right eye that started yesterday midday.  He did flush his eye but reports that it did not improve his sensation of a foreign body.  He denies any blurry vision but he is markedly light sensitive.  Past Medical History:  Diagnosis Date   Alcoholism (HCC)    Anemia    Anxiety    Celiac disease    Depression    Dysphagia    Esophageal stricture    Fatty liver    GERD (gastroesophageal reflux disease)    Hyperlipidemia    Hypertension    Iron deficiency anemia    Large hiatal hernia    Systolic ejection murmur    Weight loss     Patient Active Problem List   Diagnosis Date Noted   Recurrent major depressive disorder, in partial remission 02/04/2024   Pharyngoesophageal dysphagia 03/29/2021   Iron deficiency anemia due to chronic blood loss    Stricture and stenosis of esophagus    Class 1 obesity due to excess calories without serious comorbidity with body mass index (BMI) of 30.0 to 30.9 in adult 05/12/2019   Anxiety and depression 05/12/2019   Gastroesophageal reflux disease 05/12/2019   Hyperlipidemia 05/12/2019   Essential hypertension 01/07/2018   History of cholesteatoma 06/10/2012   Mixed conductive and sensorineural hearing loss of left ear 06/10/2012    Past Surgical History:  Procedure Laterality Date   COLONOSCOPY WITH PROPOFOL  N/A 08/16/2020   Procedure: COLONOSCOPY WITH PROPOFOL ;  Surgeon: Jinny Carmine, MD;  Location: [facility];  Service: Endoscopy;  Laterality: N/A;   ESOPHAGEAL DILATION  08/16/2020   Procedure: ESOPHAGEAL  DILATION;  Surgeon: Jinny Carmine, MD;  Location: [facility];  Service: Endoscopy;;   ESOPHAGOGASTRODUODENOSCOPY (EGD) WITH PROPOFOL  N/A 08/16/2020   Procedure: ESOPHAGOGASTRODUODENOSCOPY (EGD) WITH PROPOFOL ;  Surgeon: Jinny Carmine, MD;  Location: [facility];  Service: Endoscopy;  Laterality: N/A;   GIVENS CAPSULE STUDY N/A 09/18/2020   Procedure: GIVENS CAPSULE STUDY;  Surgeon: Jinny Carmine, MD;  Location: [facility];  Service: Endoscopy;  Laterality: N/A;   HEMORRHOID SURGERY     INNER EAR SURGERY     LUMBAR FUSION  2011       Home Medications    Prior to Admission medications  Medication Sig Start Date End Date Taking? Authorizing Provider  moxifloxacin  (VIGAMOX ) 0.5 % ophthalmic solution Place 1 drop into the right eye every hour as needed for up to 7 days. Instill 1 drop of Vigamox  in your right eye every hour while awake today. 03/26/24 04/02/24 Yes Bernardino Ditch, NP  amLODipine  (NORVASC ) 10 MG tablet TAKE 1 TABLET(10 MG) BY MOUTH DAILY 09/10/23   Joshua Cathryne BROCKS, MD  atorvastatin  (LIPITOR) 20 MG tablet Take 1 tablet (20 mg total) by mouth daily. 09/10/23   Joshua Cathryne BROCKS, MD  chlorthalidone  (HYGROTON ) 25 MG tablet Take 1 tablet (25 mg total) by mouth daily. 02/04/24   Kotturi, Vinay K, MD  ferrous sulfate  325 (65 FE) MG tablet Take 1 tablet (325 mg total) by mouth daily. 03/21/22 03/03/24  Willo Dunnings, MD  losartan  (COZAAR ) 100 MG tablet TAKE 1 TABLET(100 MG) BY MOUTH DAILY 09/10/23   Joshua Cathryne BROCKS, MD  omeprazole  (PRILOSEC) 40 MG capsule TAKE 1 CAPSULE(40 MG) BY MOUTH EVERY MORNING 09/10/23   Joshua Cathryne BROCKS, MD  potassium chloride  (KLOR-CON  M) 10 MEQ tablet Take 1 tablet (10 mEq total) by mouth 2 (two) times daily. Patient not taking: Reported on 03/03/2024 09/11/23   Joshua Cathryne BROCKS, MD  sertraline  (ZOLOFT ) 100 MG tablet TAKE 2 TABLETS(200 MG) BY MOUTH DAILY 09/10/23   Joshua Cathryne BROCKS, MD    Family History Family History  Problem Relation Age of Onset    Hypertension Mother    Healthy Father    Cancer Maternal Grandfather     Social History Social History[1]   Allergies   Patient has no known allergies.   Review of Systems Review of Systems  Eyes:  Positive for photophobia, pain and discharge. Negative for redness, itching and visual disturbance.     Physical Exam Triage Vital Signs ED Triage Vitals  Encounter Vitals Group     BP 03/26/24 1135 (!) 150/91     Girls Systolic BP Percentile --      Girls Diastolic BP Percentile --      Boys Systolic BP Percentile --      Boys Diastolic BP Percentile --      Pulse Rate 03/26/24 1135 76     Resp 03/26/24 1135 16     Temp 03/26/24 1135 98.4 F (36.9 C)     Temp Source 03/26/24 1135 Oral     SpO2 03/26/24 1135 95 %     Weight 03/26/24 1137 191 lb 14.4 oz (87 kg)     Height --      Head Circumference --      Peak Flow --      Pain Score 03/26/24 1135 0     Pain Loc --      Pain Education --      Exclude from Growth Chart --    No data found.  Updated Vital Signs BP (!) 150/91 (BP Location: Right Arm)   Pulse 76   Temp 98.4 F (36.9 C) (Oral)   Resp 16   Wt 191 lb 14.4 oz (87 kg)   SpO2 95%   BMI 27.53 kg/m   Visual Acuity Right Eye Distance: 20/25 Left Eye Distance: 20/40 Bilateral Distance:    Right Eye Near:   Left Eye Near:    Bilateral Near:     Physical Exam Vitals and nursing note reviewed.  Constitutional:      General: He is in acute distress.     Appearance: Normal appearance. He is not ill-appearing.     Comments: Patient is squinting and appears to be marked degree of discomfort.  HENT:     Head: Normocephalic and atraumatic.  Eyes:     General: No scleral icterus.       Right eye: No discharge.        Left eye: No discharge.     Extraocular Movements: Extraocular movements intact.     Conjunctiva/sclera: Conjunctivae normal.     Pupils: Pupils are equal, round, and reactive to light.  Skin:    General: Skin is warm and dry.      Capillary Refill: Capillary refill takes less than 2 seconds.  Neurological:     General: No focal deficit present.     Mental Status: He is alert and oriented to person,  place, and time.      UC Treatments / Results  Labs (all labs ordered are listed, but only abnormal results are displayed) Labs Reviewed - No data to display  EKG   Radiology No results found.  Procedures Procedures (including critical care time)  Medications Ordered in UC Medications - No data to display  Initial Impression / Assessment and Plan / UC Course  I have reviewed the triage vital signs and the nursing notes.  Pertinent labs & imaging results that were available during my care of the patient were reviewed by me and considered in my medical decision making (see chart for details).   Patient is a nontoxic-appearing 63 year old male presenting for evaluation of a foreign body sensation in his right eye that started yesterday in the midday.  He denies being hit in the eye by anything or any trauma.  He does not wear contact lenses.  He states that the pain started all of a sudden.  He feels like something is trapped under his upper eyelid.  He is markedly light sensitive and he is tearing.  As you can see in image above, there is no erythema or edema to the upper or lower eyelids and the bulbar conjunctiva is unremarkable.  He has a normal red light reflex in the right eye and his pupils equal round reactive.  EOM does increase his sensation of discomfort.  He does wear reading glasses.  Visual acuity 0D 20/25, OS 20/40.  I anesthetized the eye with 2 drops of tetracaine and then used 2 cotton tip applicators to invert the upper eyelid.  I do not appreciate any hordeolum or foreign body.  I then stained the eye with fluorescein dye for Woods lamp examination.  As you can see in image above, the patient has what appears to be a corneal ulcer as well as a surrounding abrasion in the lower inner quadrant at  approximately 5:00.  I do not appreciate any foreign body.  Patient is reporting mild improvement of pain with tetracaine.  I called and spoke with Dr. Mittie who is on-call for [facility].  He is recommending moxifloxacin  1 drop every hour while awake today and he will see him in the [place] office tomorrow at noon.  We will update the patient's tetanus vaccination prior to discharge.   Final Clinical Impressions(s) / UC Diagnoses   Final diagnoses:  Corneal ulcer of right eye  Abrasion of right cornea, initial encounter     Discharge Instructions      I have spoken to Dr. Mittie with [facility] and he would like you to instill 1 drop of Vigamox  antibiotic eyedrops in your right eye every hour while awake for the remainder of today.  Avoid rubbing your eye is much as possible.  You may use over-the-counter Tylenol  and/or ibuprofen according to the package instructions as needed for pain.  He will see you in his office in [place] tomorrow at noon.  The address is [address], [place], [place].  [address].  He has instructed me to advise you to go to the door towards the rear on the side marked after hours insurance.  He will need to there at noon.     ED Prescriptions     Medication Sig Dispense Auth. Provider   moxifloxacin  (VIGAMOX ) 0.5 % ophthalmic solution Place 1 drop into the right eye every hour as needed for up to 7 days. Instill 1 drop of Vigamox  in your right eye  every hour while awake today. 3 mL Bernardino Ditch, NP      PDMP not reviewed this encounter.    Bernardino Ditch, NP 03/26/24 1207     [1]  Social History Tobacco Use   Smoking status: Former    Current packs/day: 0.00    Average packs/day: 0.5 packs/day for 20.0 years (10.0 ttl pk-yrs)    Types: Cigarettes    Start date: 04/14/2000    Quit date: 04/14/2020    Years since quitting: 3.9   Smokeless tobacco: Never  Vaping Use   Vaping status: Never Used  Substance Use  Topics   Alcohol use: Yes    Alcohol/week: 12.0 standard drinks of alcohol    Types: 12 Standard drinks or equivalent per week    Comment: 4 airplane bottles vodka approx 3 days per week 01/14/24   Drug use: Not Currently    Types: Marijuana    Comment: last use July 2025     Bernardino Ditch, NP 03/26/24 1208

## 2024-03-31 ENCOUNTER — Ambulatory Visit: Admitting: Gastroenterology

## 2024-04-01 ENCOUNTER — Other Ambulatory Visit: Payer: Self-pay

## 2024-04-01 DIAGNOSIS — K219 Gastro-esophageal reflux disease without esophagitis: Secondary | ICD-10-CM

## 2024-04-01 MED ORDER — OMEPRAZOLE 40 MG PO CPDR: DELAYED_RELEASE_CAPSULE | ORAL | 1 refills | Status: AC

## 2024-04-29 ENCOUNTER — Ambulatory Visit

## 2024-04-29 ENCOUNTER — Ambulatory Visit
Admission: EM | Admit: 2024-04-29 | Discharge: 2024-04-29 | Disposition: A | Discharge status: Home / Self Care | Attending: Physician Assistant | Admitting: Physician Assistant

## 2024-04-29 ENCOUNTER — Encounter: Payer: Self-pay | Admitting: Emergency Medicine

## 2024-04-29 DIAGNOSIS — M67912 Unspecified disorder of synovium and tendon, left shoulder: Secondary | ICD-10-CM | POA: Diagnosis not present

## 2024-04-29 DIAGNOSIS — M25512 Pain in left shoulder: Secondary | ICD-10-CM

## 2024-04-29 DIAGNOSIS — G8929 Other chronic pain: Secondary | ICD-10-CM

## 2024-04-29 DIAGNOSIS — Z8781 Personal history of (healed) traumatic fracture: Secondary | ICD-10-CM | POA: Diagnosis not present

## 2024-04-29 MED ORDER — KETOROLAC TROMETHAMINE 30 MG/ML IJ SOLN
30.0000 mg | Freq: Once | INTRAMUSCULAR | Status: AC
  Administered 2024-04-29: 30 mg via INTRAMUSCULAR

## 2024-04-29 MED ORDER — HYDROCODONE-ACETAMINOPHEN 5-325 MG PO TABS: 1.0000 | ORAL_TABLET | Freq: Three times a day (TID) | ORAL | 0 refills | Status: AC | PRN

## 2024-04-29 NOTE — ED Provider Notes (Signed)
 " [facility]    CSN: [id number] Arrival date & time: 04/29/24  9062      History   Chief Complaint Chief Complaint  Patient presents with   Shoulder Pain    HPI Chad Hudson is a 64 y.o. male presenting for intermittent left shoulder pain x 2-3 months. Patient reports increased pain x1 week with significantly reduced ROM of the shoulder and pain with any movement of shoulder especially abduction, flexion, and external rotation.  Has been taking Motrin without relief.   Fractured left clavicle in 2023 after he fell out of bed. Never had surgery.   Patient seen by PCP 2 months ago for left shoulder pain. Per PCP note from 03/03/24: He has been experiencing left shoulder pain for about one to two weeks, significant enough to prevent him from lying on that side or reaching out. Activities such as lifting, reaching, and pulling exacerbate the pain, making it difficult to perform tasks like getting clothes out of the dryer. He has not had previous shoulder problems and does not recall which clavicle he previously broke.   Patient has upcoming appointment with orthopedics on 05/11/24 for left shoulder pain.  HPI  Past Medical History:  Diagnosis Date   Alcoholism (HCC)    Anemia    Anxiety    Celiac disease    Depression    Dysphagia    Esophageal stricture    Fatty liver    GERD (gastroesophageal reflux disease)    Hyperlipidemia    Hypertension    Iron deficiency anemia    Large hiatal hernia    Systolic ejection murmur    Weight loss     Patient Active Problem List   Diagnosis Date Noted   Recurrent major depressive disorder, in partial remission 02/04/2024   Pharyngoesophageal dysphagia 03/29/2021   Iron deficiency anemia due to chronic blood loss    Stricture and stenosis of esophagus    Class 1 obesity due to excess calories without serious comorbidity with body mass index (BMI) of 30.0 to 30.9 in adult 05/12/2019   Anxiety and depression 05/12/2019    Gastroesophageal reflux disease 05/12/2019   Hyperlipidemia 05/12/2019   Essential hypertension 01/07/2018   History of cholesteatoma 06/10/2012   Mixed conductive and sensorineural hearing loss of left ear 06/10/2012    Past Surgical History:  Procedure Laterality Date   COLONOSCOPY WITH PROPOFOL  N/A 08/16/2020   Procedure: COLONOSCOPY WITH PROPOFOL ;  Surgeon: Jinny Carmine, MD;  Location: [facility];  Service: Endoscopy;  Laterality: N/A;   ESOPHAGEAL DILATION  08/16/2020   Procedure: ESOPHAGEAL DILATION;  Surgeon: Jinny Carmine, MD;  Location: [facility];  Service: Endoscopy;;   ESOPHAGOGASTRODUODENOSCOPY (EGD) WITH PROPOFOL  N/A 08/16/2020   Procedure: ESOPHAGOGASTRODUODENOSCOPY (EGD) WITH PROPOFOL ;  Surgeon: Jinny Carmine, MD;  Location: [facility];  Service: Endoscopy;  Laterality: N/A;   GIVENS CAPSULE STUDY N/A 09/18/2020   Procedure: GIVENS CAPSULE STUDY;  Surgeon: Jinny Carmine, MD;  Location: [facility];  Service: Endoscopy;  Laterality: N/A;   HEMORRHOID SURGERY     INNER EAR SURGERY     LUMBAR FUSION  2011       Home Medications    Prior to Admission medications  Medication Sig Start Date End Date Taking? Authorizing Provider  HYDROcodone -acetaminophen  (NORCO/VICODIN) 5-325 MG tablet Take 1 tablet by mouth every 8 (eight) hours as needed for up to 5 days for severe pain (pain score 7-10). 04/29/24 05/04/24 Yes Arvis Huxley B, PA-C  amLODipine  (NORVASC )  10 MG tablet TAKE 1 TABLET(10 MG) BY MOUTH DAILY 09/10/23   Joshua Cathryne BROCKS, MD  atorvastatin  (LIPITOR) 20 MG tablet Take 1 tablet (20 mg total) by mouth daily. 09/10/23   Joshua Cathryne BROCKS, MD  chlorthalidone  (HYGROTON ) 25 MG tablet Take 1 tablet (25 mg total) by mouth daily. 02/04/24   Kotturi, Vinay K, MD  ferrous sulfate  325 (65 FE) MG tablet Take 1 tablet (325 mg total) by mouth daily. 03/21/22 03/03/24  Willo Dunnings, MD  losartan  (COZAAR ) 100 MG tablet TAKE 1 TABLET(100 MG) BY MOUTH DAILY 09/10/23    Joshua Cathryne BROCKS, MD  omeprazole  (PRILOSEC) 40 MG capsule TAKE 1 CAPSULE(40 MG) BY MOUTH EVERY MORNING 04/01/24   Kotturi, Vinay K, MD  potassium chloride  (KLOR-CON  M) 10 MEQ tablet Take 1 tablet (10 mEq total) by mouth 2 (two) times daily. Patient not taking: Reported on 02/04/2024 09/11/23   Joshua Cathryne BROCKS, MD  sertraline  (ZOLOFT ) 100 MG tablet TAKE 2 TABLETS(200 MG) BY MOUTH DAILY 09/10/23   Joshua Cathryne BROCKS, MD    Family History Family History  Problem Relation Age of Onset   Hypertension Mother    Healthy Father    Cancer Maternal Grandfather     Social History Social History[1]   Allergies   Patient has no known allergies.   Review of Systems Review of Systems  Musculoskeletal:  Positive for arthralgias. Negative for joint swelling and neck pain.  Neurological:  Negative for weakness and numbness.     Physical Exam Triage Vital Signs ED Triage Vitals  Encounter Vitals Group     BP      Girls Systolic BP Percentile      Girls Diastolic BP Percentile      Boys Systolic BP Percentile      Boys Diastolic BP Percentile      Pulse      Resp      Temp      Temp src      SpO2      Weight      Height      Head Circumference      Peak Flow      Pain Score      Pain Loc      Pain Education      Exclude from Growth Chart    No data found.  Updated Vital Signs BP (!) 168/104 (BP Location: Right Arm) Comment: Patient has not taken BP medicine today  Pulse 74   Temp 98.2 F (36.8 C) (Oral)   Resp 15   Ht 5' 10 (1.778 m)   Wt 191 lb 12.8 oz (87 kg)   SpO2 95%   BMI 27.52 kg/m      Physical Exam Vitals and nursing note reviewed.  Constitutional:      General: He is not in acute distress.    Appearance: Normal appearance. He is well-developed. He is not ill-appearing.  HENT:     Head: Normocephalic and atraumatic.  Eyes:     General: No scleral icterus.    Conjunctiva/sclera: Conjunctivae normal.  Cardiovascular:     Rate and Rhythm: Normal rate.   Pulmonary:     Effort: Pulmonary effort is normal. No respiratory distress.  Musculoskeletal:     Left shoulder: Tenderness (anterior shoulder) present. No swelling. Decreased range of motion. Normal pulse.     Cervical back: Neck supple.     Comments: Significantly reduced ROM of the left shoulder especially with abduction, flexion and external  rotation.   Skin:    General: Skin is warm and dry.     Capillary Refill: Capillary refill takes less than 2 seconds.  Neurological:     Mental Status: He is alert.  Psychiatric:        Mood and Affect: Mood normal.        Behavior: Behavior normal.      UC Treatments / Results  Labs (all labs ordered are listed, but only abnormal results are displayed) Labs Reviewed - No data to display  EKG   Radiology DG Shoulder Left Result Date: 04/29/2024 CLINICAL DATA:  Left shoulder pain for 1 week without recent injury or fall EXAM: LEFT SHOULDER - 2+ VIEW COMPARISON:  December 31, 2021 FINDINGS: Old distal left clavicular fracture is noted with persistent nonunion. No dislocation or acute fracture is noted. 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 No acute abnormality seen. Electronically Signed   By: Lynwood Landy Raddle M.D.   On: 04/29/2024 10:23   Study Result  Narrative & Impression  CLINICAL DATA:  fall, shoulder pain   EXAM: LEFT SHOULDER - 2+ VIEW   COMPARISON:  None Available.   FINDINGS: There is a comminuted distal left clavicle fracture with mild inferior displacement. Normal AC joint alignment. No evidence of scapular fracture or proximal humerus fracture. Mild glenohumeral and AC joint degenerative change.   IMPRESSION: Comminuted distal left clavicle fracture with mild inferior displacement.     Electronically Signed   By: Jacob  Kahn M.D.   On: 12/31/2021 11:50   Procedures Procedures (including critical care time)  Medications Ordered in UC Medications  ketorolac  (TORADOL ) 30  MG/ML injection 30 mg (30 mg Intramuscular Given 04/29/24 1032)    Initial Impression / Assessment and Plan / UC Course  I have reviewed the triage vital signs and the nursing notes.  Pertinent labs & imaging results that were available during my care of the patient were reviewed by me and considered in my medical decision making (see chart for details).   64 year old male presents for pain of the left shoulder worsening over the past 1 week with significantly reduced range of motion.  He was seen by PCP 2 months ago for left shoulder pain.  History of comminuted distal left clavicle fracture with mild inferior displacement in 2023.  Never had surgery on the shoulder.  No new injuries but he does care for his elderly mother.  On exam he has generalized tenderness of the anterior shoulder especially at the biceps groove.  Significantly reduced range of motion of the shoulder.  X-ray obtained today shows old distal left clavicle fracture with persistent nonunion but no acute abnormalities. Reviewed and attached old imaging results. Reviewed most recent CMP to assess renal function.  Reviewed results of imaging with patient.  Suspect rotator cuff tear and symptoms likely related to history of fracture of shoulder.  Reviewed my concerns with patient.  He has a sling at home that he will use.  He was given 30 mg IM ketorolac  in clinic for acute pain relief and after reviewing controlled substance database I sent #15 Norco to pharmacy for severe pain.  He has an appointment with orthopedics in 12 days.  Advised to keep the appointment.  He will likely need MRI and further follow-up with the orthopedist.  Patient is understanding and agreeable.   Final Clinical Impressions(s) / UC Diagnoses   Final diagnoses:  Chronic left shoulder pain  Disorder of left rotator cuff  History of fracture of clavicle     Discharge Instructions      - Please keep your appointment with orthopedics.  You will  likely need an MRI of your shoulder to further evaluate the rotator cuff. - Until then you may take anti-inflammatory medication at low-dose fluids, rest, elevate and ice extremity.  Avoid painful movements. - I sent something as needed for pain when it becomes severe.  You should use your sling to avoid any painful movements of the shoulder.     ED Prescriptions     Medication Sig Dispense Auth. Provider   HYDROcodone -acetaminophen  (NORCO/VICODIN) 5-325 MG tablet Take 1 tablet by mouth every 8 (eight) hours as needed for up to 5 days for severe pain (pain score 7-10). 15 tablet Arvis Jolan NOVAK, PA-C      I have reviewed the PDMP during this encounter.     [1]  Social History Tobacco Use   Smoking status: Former    Current packs/day: 0.00    Average packs/day: 0.5 packs/day for 20.0 years (10.0 ttl pk-yrs)    Types: Cigarettes    Start date: 04/14/2000    Quit date: 04/14/2020    Years since quitting: 4.0   Smokeless tobacco: Never  Vaping Use   Vaping status: Never Used  Substance Use Topics   Alcohol use: Yes    Alcohol/week: 12.0 standard drinks of alcohol    Types: 12 Standard drinks or equivalent per week    Comment: 4 airplane bottles vodka approx 3 days per week 01/14/24   Drug use: Not Currently    Types: Marijuana    Comment: last use July 2025     Arvis Jolan NOVAK, [place] 04/29/24 1053  "

## 2024-04-29 NOTE — Discharge Instructions (Addendum)
-   Please keep your appointment with orthopedics.  You will likely need an MRI of your shoulder to further evaluate the rotator cuff. - Until then you may take anti-inflammatory medication at low-dose fluids, rest, elevate and ice extremity.  Avoid painful movements. - I sent something as needed for pain when it becomes severe.  You should use your sling to avoid any painful movements of the shoulder.

## 2024-04-29 NOTE — ED Triage Notes (Signed)
 Patient c/o left shoulder pain for a week.  Patient denies injury or fall.  Patient reports limited ROM and pain worse with movement.

## 2024-05-12 ENCOUNTER — Ambulatory Visit: Admitting: Gastroenterology

## 2024-07-01 ENCOUNTER — Ambulatory Visit: Admitting: Family Medicine

## 2024-07-21 ENCOUNTER — Ambulatory Visit: Admitting: Gastroenterology

## 2025-01-25 ENCOUNTER — Ambulatory Visit

## 2025-01-26 ENCOUNTER — Ambulatory Visit
